# Patient Record
Sex: Male | Born: 1960 | Race: White | Hispanic: No | Marital: Married | Smoking: Never smoker
Health system: Southern US, Community
[De-identification: ages and names within clinical notes are randomized; demographics above are authoritative.]

## PROBLEM LIST (undated history)

## (undated) DIAGNOSIS — M109 Gout, unspecified: Secondary | ICD-10-CM

## (undated) DIAGNOSIS — Z8711 Personal history of peptic ulcer disease: Secondary | ICD-10-CM

## (undated) DIAGNOSIS — I10 Essential (primary) hypertension: Secondary | ICD-10-CM

## (undated) HISTORY — PX: CARPAL TUNNEL RELEASE: SHX101

---

## 2014-08-24 ENCOUNTER — Inpatient Hospital Stay (HOSPITAL_COMMUNITY)
Admission: EM | Admit: 2014-08-24 | Discharge: 2014-08-28 | DRG: 683 | Disposition: A | Discharge status: Home / Self Care | Payer: BLUE CROSS/BLUE SHIELD | Attending: Family Medicine | Admitting: Family Medicine

## 2014-08-24 ENCOUNTER — Encounter (HOSPITAL_COMMUNITY): Payer: Self-pay | Admitting: Emergency Medicine

## 2014-08-24 ENCOUNTER — Emergency Department (HOSPITAL_COMMUNITY): Payer: BLUE CROSS/BLUE SHIELD

## 2014-08-24 DIAGNOSIS — K219 Gastro-esophageal reflux disease without esophagitis: Secondary | ICD-10-CM | POA: Diagnosis present

## 2014-08-24 DIAGNOSIS — E872 Acidosis, unspecified: Secondary | ICD-10-CM

## 2014-08-24 DIAGNOSIS — E876 Hypokalemia: Secondary | ICD-10-CM | POA: Diagnosis present

## 2014-08-24 DIAGNOSIS — M109 Gout, unspecified: Secondary | ICD-10-CM | POA: Diagnosis present

## 2014-08-24 DIAGNOSIS — N19 Unspecified kidney failure: Secondary | ICD-10-CM | POA: Diagnosis not present

## 2014-08-24 DIAGNOSIS — R945 Abnormal results of liver function studies: Secondary | ICD-10-CM

## 2014-08-24 DIAGNOSIS — E1165 Type 2 diabetes mellitus with hyperglycemia: Secondary | ICD-10-CM | POA: Diagnosis present

## 2014-08-24 DIAGNOSIS — E860 Dehydration: Secondary | ICD-10-CM | POA: Diagnosis present

## 2014-08-24 DIAGNOSIS — N179 Acute kidney failure, unspecified: Principal | ICD-10-CM | POA: Diagnosis present

## 2014-08-24 DIAGNOSIS — Z825 Family history of asthma and other chronic lower respiratory diseases: Secondary | ICD-10-CM | POA: Diagnosis not present

## 2014-08-24 DIAGNOSIS — I10 Essential (primary) hypertension: Secondary | ICD-10-CM | POA: Diagnosis present

## 2014-08-24 DIAGNOSIS — G8929 Other chronic pain: Secondary | ICD-10-CM | POA: Diagnosis present

## 2014-08-24 DIAGNOSIS — R197 Diarrhea, unspecified: Secondary | ICD-10-CM | POA: Diagnosis not present

## 2014-08-24 DIAGNOSIS — E871 Hypo-osmolality and hyponatremia: Secondary | ICD-10-CM

## 2014-08-24 DIAGNOSIS — R112 Nausea with vomiting, unspecified: Secondary | ICD-10-CM | POA: Diagnosis not present

## 2014-08-24 DIAGNOSIS — R52 Pain, unspecified: Secondary | ICD-10-CM

## 2014-08-24 DIAGNOSIS — E119 Type 2 diabetes mellitus without complications: Secondary | ICD-10-CM

## 2014-08-24 DIAGNOSIS — A084 Viral intestinal infection, unspecified: Secondary | ICD-10-CM | POA: Diagnosis present

## 2014-08-24 DIAGNOSIS — Z8249 Family history of ischemic heart disease and other diseases of the circulatory system: Secondary | ICD-10-CM

## 2014-08-24 DIAGNOSIS — R7989 Other specified abnormal findings of blood chemistry: Secondary | ICD-10-CM

## 2014-08-24 HISTORY — DX: Essential (primary) hypertension: I10

## 2014-08-24 HISTORY — DX: Personal history of peptic ulcer disease: Z87.11

## 2014-08-24 HISTORY — DX: Gout, unspecified: M10.9

## 2014-08-24 LAB — CBC WITH DIFFERENTIAL/PLATELET
BASOS PCT: 0 % (ref 0–1)
Basophils Absolute: 0 10*3/uL (ref 0.0–0.1)
Eosinophils Absolute: 0 10*3/uL (ref 0.0–0.7)
Eosinophils Relative: 0 % (ref 0–5)
HEMATOCRIT: 43.6 % (ref 39.0–52.0)
HEMOGLOBIN: 16.1 g/dL (ref 13.0–17.0)
LYMPHS PCT: 9 % — AB (ref 12–46)
Lymphs Abs: 1 10*3/uL (ref 0.7–4.0)
MCH: 32.3 pg (ref 26.0–34.0)
MCHC: 36.9 g/dL — ABNORMAL HIGH (ref 30.0–36.0)
MCV: 87.6 fL (ref 78.0–100.0)
MONO ABS: 1.6 10*3/uL — AB (ref 0.1–1.0)
Monocytes Relative: 15 % — ABNORMAL HIGH (ref 3–12)
Neutro Abs: 8.5 10*3/uL — ABNORMAL HIGH (ref 1.7–7.7)
Neutrophils Relative %: 76 % (ref 43–77)
Platelets: 186 10*3/uL (ref 150–400)
RBC: 4.98 MIL/uL (ref 4.22–5.81)
RDW: 12.5 % (ref 11.5–15.5)
WBC: 11.2 10*3/uL — ABNORMAL HIGH (ref 4.0–10.5)

## 2014-08-24 LAB — MAGNESIUM: Magnesium: 2.1 mg/dL (ref 1.7–2.4)

## 2014-08-24 LAB — COMPREHENSIVE METABOLIC PANEL
ALK PHOS: 71 U/L (ref 38–126)
ALT: 88 U/L — ABNORMAL HIGH (ref 17–63)
ANION GAP: 22 — AB (ref 5–15)
AST: 113 U/L — ABNORMAL HIGH (ref 15–41)
Albumin: 3.6 g/dL (ref 3.5–5.0)
BUN: 65 mg/dL — AB (ref 6–20)
CALCIUM: 8 mg/dL — AB (ref 8.9–10.3)
CO2: 14 mmol/L — ABNORMAL LOW (ref 22–32)
Chloride: 87 mmol/L — ABNORMAL LOW (ref 101–111)
Creatinine, Ser: 4.86 mg/dL — ABNORMAL HIGH (ref 0.61–1.24)
GFR calc Af Amer: 14 mL/min — ABNORMAL LOW (ref >60)
GFR, EST NON AFRICAN AMERICAN: 12 mL/min — AB (ref >60)
Glucose, Bld: 295 mg/dL — ABNORMAL HIGH (ref 65–99)
POTASSIUM: 2.7 mmol/L — AB (ref 3.5–5.1)
Sodium: 123 mmol/L — ABNORMAL LOW (ref 135–145)
Total Bilirubin: 0.9 mg/dL (ref 0.3–1.2)
Total Protein: 7.9 g/dL (ref 6.5–8.1)

## 2014-08-24 LAB — BASIC METABOLIC PANEL
Anion gap: 16 — ABNORMAL HIGH (ref 5–15)
BUN: 63 mg/dL — ABNORMAL HIGH (ref 6–20)
CALCIUM: 7.1 mg/dL — AB (ref 8.9–10.3)
CO2: 15 mmol/L — AB (ref 22–32)
CREATININE: 4.43 mg/dL — AB (ref 0.61–1.24)
Chloride: 94 mmol/L — ABNORMAL LOW (ref 101–111)
GFR calc Af Amer: 16 mL/min — ABNORMAL LOW (ref >60)
GFR calc non Af Amer: 14 mL/min — ABNORMAL LOW (ref >60)
Glucose, Bld: 229 mg/dL — ABNORMAL HIGH (ref 65–99)
POTASSIUM: 3.1 mmol/L — AB (ref 3.5–5.1)
Sodium: 125 mmol/L — ABNORMAL LOW (ref 135–145)

## 2014-08-24 LAB — GLUCOSE, CAPILLARY: GLUCOSE-CAPILLARY: 225 mg/dL — AB (ref 65–99)

## 2014-08-24 LAB — LACTIC ACID, PLASMA: Lactic Acid, Venous: 1.7 mmol/L (ref 0.5–2.0)

## 2014-08-24 MED ORDER — SODIUM CHLORIDE 0.9 % IJ SOLN
3.0000 mL | Freq: Two times a day (BID) | INTRAMUSCULAR | Status: DC
  Administered 2014-08-24 – 2014-08-28 (×4): 3 mL via INTRAVENOUS

## 2014-08-24 MED ORDER — RISAQUAD PO CAPS
2.0000 | ORAL_CAPSULE | Freq: Every day | ORAL | Status: DC
  Administered 2014-08-24 – 2014-08-28 (×5): 2 via ORAL
  Filled 2014-08-24 (×5): qty 2

## 2014-08-24 MED ORDER — TRAZODONE HCL 50 MG PO TABS
50.0000 mg | ORAL_TABLET | Freq: Every evening | ORAL | Status: DC | PRN
  Administered 2014-08-25: 100 mg via ORAL
  Filled 2014-08-24 (×2): qty 2

## 2014-08-24 MED ORDER — ACETAMINOPHEN 325 MG PO TABS
650.0000 mg | ORAL_TABLET | Freq: Four times a day (QID) | ORAL | Status: DC | PRN
Start: 2014-08-24 — End: 2014-08-28
  Filled 2014-08-24 (×2): qty 2

## 2014-08-24 MED ORDER — PANTOPRAZOLE SODIUM 40 MG PO TBEC
40.0000 mg | DELAYED_RELEASE_TABLET | Freq: Every day | ORAL | Status: DC
  Administered 2014-08-24 – 2014-08-28 (×5): 40 mg via ORAL
  Filled 2014-08-24 (×5): qty 1

## 2014-08-24 MED ORDER — ONDANSETRON HCL 4 MG PO TABS: 4.0000 mg | ORAL_TABLET | Freq: Four times a day (QID) | ORAL | Status: DC | PRN

## 2014-08-24 MED ORDER — ACETAMINOPHEN 650 MG RE SUPP: 650.0000 mg | Freq: Four times a day (QID) | RECTAL | Status: DC | PRN

## 2014-08-24 MED ORDER — SODIUM CHLORIDE 0.9 % IV SOLN
INTRAVENOUS | Status: DC
  Administered 2014-08-25 – 2014-08-26 (×4): via INTRAVENOUS
  Administered 2014-08-26: 75 mL/h via INTRAVENOUS
  Administered 2014-08-27: 10:00:00 via INTRAVENOUS

## 2014-08-24 MED ORDER — POTASSIUM CHLORIDE CRYS ER 20 MEQ PO TBCR
40.0000 meq | EXTENDED_RELEASE_TABLET | Freq: Once | ORAL | Status: AC
  Administered 2014-08-24: 40 meq via ORAL
  Filled 2014-08-24: qty 2

## 2014-08-24 MED ORDER — ONDANSETRON HCL 4 MG/2ML IJ SOLN: 4.0000 mg | Freq: Four times a day (QID) | INTRAMUSCULAR | Status: DC | PRN

## 2014-08-24 MED ORDER — SODIUM CHLORIDE 0.9 % IV BOLUS (SEPSIS)
2000.0000 mL | Freq: Once | INTRAVENOUS | Status: AC
  Administered 2014-08-24: 2000 mL via INTRAVENOUS

## 2014-08-24 MED ORDER — HEPARIN SODIUM (PORCINE) 5000 UNIT/ML IJ SOLN
5000.0000 [IU] | Freq: Three times a day (TID) | INTRAMUSCULAR | Status: DC
  Administered 2014-08-24 – 2014-08-28 (×11): 5000 [IU] via SUBCUTANEOUS
  Filled 2014-08-24 (×10): qty 1

## 2014-08-24 MED ORDER — KETOROLAC TROMETHAMINE 30 MG/ML IJ SOLN
30.0000 mg | Freq: Once | INTRAMUSCULAR | Status: AC
  Administered 2014-08-24: 30 mg via INTRAVENOUS
  Filled 2014-08-24: qty 1

## 2014-08-24 MED ORDER — INSULIN ASPART 100 UNIT/ML ~~LOC~~ SOLN
0.0000 [IU] | Freq: Three times a day (TID) | SUBCUTANEOUS | Status: DC
  Administered 2014-08-25: 2 [IU] via SUBCUTANEOUS
  Administered 2014-08-25: 1 [IU] via SUBCUTANEOUS
  Administered 2014-08-25: 2 [IU] via SUBCUTANEOUS
  Administered 2014-08-26 – 2014-08-28 (×7): 1 [IU] via SUBCUTANEOUS

## 2014-08-24 MED ORDER — POTASSIUM CHLORIDE 10 MEQ/100ML IV SOLN
10.0000 meq | Freq: Once | INTRAVENOUS | Status: AC
  Administered 2014-08-24: 10 meq via INTRAVENOUS
  Filled 2014-08-24: qty 100

## 2014-08-24 MED ORDER — POTASSIUM CHLORIDE 10 MEQ/100ML IV SOLN
10.0000 meq | INTRAVENOUS | Status: AC
  Administered 2014-08-24 – 2014-08-25 (×6): 10 meq via INTRAVENOUS
  Filled 2014-08-24 (×4): qty 100

## 2014-08-24 NOTE — ED Notes (Signed)
CRITICAL VALUE ALERT  Critical value received:  POTASSIUM 2.7  Date of notification:  08/24/14  Time of notification:  1748  Critical value read back:Yes.    Nurse who received alert:  Viviano SimasLauren Stevie Ertle, RN  MD notified (1st page):  [place]  Time of first page:  [phone]

## 2014-08-24 NOTE — ED Notes (Signed)
Pt reports generalized body aches,nausea,emesis,diarrhea, intermittent fever.nad noted.

## 2014-08-24 NOTE — H&P (Signed)
[facility] History and Physical  Bruce Blevins ZOX:[id number] DOB: Mar 23, 1960 DOA: 08/24/2014  Referring physician: Dr. Estell Harpin - [facility] PCP: No primary care provider on file.   Chief Complaint: Diarrhea  HPI: Bruce Blevins is a 54 y.o. male  Diarrhea. Started 4-5 days ago. Watery and non-bloody. Getting worse. 10-12 Episodes per day . Started a couple hours after lunch. Initially felt weak and feverish. Symptoms come and go. Has not taken anything for the symptoms. Ice and water w/o improvement. Anorexia during this time. No nausea.  No sick contacts    Review of Systems:  Constitutional:  No weight loss, night sweats, Fevers, chills, fatigue.  HEENT:  No headaches, Difficulty swallowing,Tooth/dental problems,Sore throat,  No sneezing, itching, ear ache, nasal congestion, post nasal drip,  Cardio-vascular:  No chest pain, Orthopnea, PND, swelling in lower extremities, anasarca, dizziness, palpitations  GI: Pe RHPI Resp:   No shortness of breath with exertion or at rest. No excess mucus, no productive cough, No non-productive cough, No coughing up of blood.No change in color of mucus.No wheezing.No chest wall deformity  Skin:  no rash or lesions.  GU:  no dysuria, change in color of urine, no urgency or frequency. No flank pain.  Musculoskeletal:   No joint pain or swelling. No decreased range of motion. No back pain.  Psych:  No change in mood or affect. No depression or anxiety. No memory loss.   Past Medical History  Diagnosis Date  . Hypertension   . History of bleeding ulcers   . Gout    Past Surgical History  Procedure Laterality Date  . Carpal tunnel release     Social History:  reports that he has never smoked. He does not have any smokeless tobacco history on file. He reports that he drinks alcohol. He reports that he does not use illicit drugs.  Allergies  Allergen Reactions  . Hydrocodone     Generalized itching.    Family History  Problem Relation  Age of Onset  . Hypertension Father   . Hypertension Mother   . COPD Mother      Prior to Admission medications   Medication Sig Start Date End Date Taking? Authorizing Provider  Acetaminophen-Caffeine 500-65 MG TABS Take 1.5-2 tablets by mouth daily as needed (for pain/migraine pain).   Yes Historical Provider, MD  amLODipine (NORVASC) 5 MG tablet Take 5 mg by mouth daily. 06/09/14  Yes Historical Provider, MD  omeprazole (PRILOSEC) 20 MG capsule Take 20 mg by mouth daily as needed (for acid reflux).   Yes Historical Provider, MD  telmisartan (MICARDIS) 80 MG tablet Take 80 mg by mouth daily. 06/09/14  Yes Historical Provider, MD  predniSONE (DELTASONE) 20 MG tablet Take 1 tablet by mouth daily. 06/25/14   Historical Provider, MD   Physical Exam: Filed Vitals:   08/24/14 1830 08/24/14 1900 08/24/14 1930 08/24/14 2000  BP: 113/80 108/65 127/81 100/62  Pulse: 88 85 89 86  Temp:    99 F (37.2 C)  TempSrc:    Oral  Resp: 22 35 23 21  Height:      Weight:      SpO2: 100% 97% 98% 97%    Wt Readings from Last 3 Encounters:  08/24/14 81.647 kg (180 lb)    General:  Appears calm and comfortable Eyes:  PERRL, normal lids, irises & conjunctiva ENT:  grossly normal hearing, lips & tongue Neck:  no LAD, masses or thyromegaly Cardiovascular:  RRR, no m/r/g. No LE edema. Telemetry:  SR, no arrhythmias  Respiratory:  CTA bilaterally, no w/r/r. Normal respiratory effort. Abdomen:  soft, ntnd, NABS Skin:  no rash or induration seen on limited exam Musculoskeletal:  grossly normal tone BUE/BLE Psychiatric:  grossly normal mood and affect, speech fluent and appropriate Neurologic:  grossly non-focal.          Labs on Admission:  Basic Metabolic Panel:  Recent Labs Lab 08/24/14 1650  NA 123*  K 2.7*  CL 87*  CO2 14*  GLUCOSE 295*  BUN 65*  CREATININE 4.86*  CALCIUM 8.0*   Liver Function Tests:  Recent Labs Lab 08/24/14 1650  AST 113*  ALT 88*  ALKPHOS 71  BILITOT 0.9    PROT 7.9  ALBUMIN 3.6   No results for input(s): LIPASE, AMYLASE in the last 168 hours. No results for input(s): AMMONIA in the last 168 hours. CBC:  Recent Labs Lab 08/24/14 1650  WBC 11.2*  NEUTROABS 8.5*  HGB 16.1  HCT 43.6  MCV 87.6  PLT 186   Cardiac Enzymes: No results for input(s): CKTOTAL, CKMB, CKMBINDEX, TROPONINI in the last 168 hours.  BNP (last 3 results) No results for input(s): BNP in the last 8760 hours.  ProBNP (last 3 results) No results for input(s): PROBNP in the last 8760 hours.  CBG: No results for input(s): GLUCAP in the last 168 hours.  Radiological Exams on Admission: Dg Abd Acute W/chest  08/24/2014   CLINICAL DATA:  Generalized body aches, weakness, nausea and diarrhea. Intermittent fever since Sunday.  EXAM: DG ABDOMEN ACUTE W/ 1V CHEST  COMPARISON:  None.  FINDINGS: Frontal view of the chest shows midline trachea and normal heart size. Minimal scarring in the medial left lower lobe. Lungs are otherwise clear.  Two views of the abdomen show gas in nondilated colon with minimal small bowel gas. Overall, relative paucity of gas in the abdomen. No unexpected radiopaque calculi. No free air.  IMPRESSION: Relative paucity of gas in the abdomen is nonspecific. No evidence of small-bowel obstruction.   Electronically Signed   By: Leanna BattlesMelinda  Blietz M.D.   On: 08/24/2014 17:23     Assessment/Plan Principal Problem:   Renal failure Active Problems:   Hyponatremia   Hypokalemia   Essential hypertension, benign   Metabolic acidosis   Diarrhea   GERD without esophagitis   Gout   ARF: minimal urine output and creatinine 4.86 on admission. Likely secondary to severe dehydration due to GI infection. - IVF - consider renal consult if not improving. - f/u BMET  Hyponatremia: likely secondary to severe GI loss. Associated left findings of hypokalemia. - Telemetry - EKG - NS IV ( goal correction of less than 8-9 mEq NA per day) - BMET Q6 - Mag - Kcl  10mEq x6  Metabolic acidosis: Anion gap 22. Bicarbonate 14. Likely mixed picture from GI losses, with bicarbonate loss from acute renal failure and anion gap from underlying infection, and possible diabetic ketoacidosis. Lactic acid 1.7. WBC 11.2. - treatment as above - no need for bicarbonate replacement at this point time.  Diarrhea: Likely secondary to viral gastroenteritis versus acute bacterial. WBC 11.2, afebrile, and no abdominal pain. No bloody stools. - C. difficile - Stool culture - probiotic - IVF as above - Zofran - Stool ova and parasite - Clear liquid diet. ADAT  Hyperglycemia: 295. Likely secondary to underlying infection and possible underlying diabetes etiology - A1c - SSI  Hypertension: Normotensive to hypotensive on admission. Likely dehydrated. - Hold home Micardis and amlodipine  GERD: - Continue PPI  Gout: No gout flare in several months. - Hold patient's when necessary prednisone  Code Status: FULL DVT Prophylaxis: Hep Family Communication: Aunt  Disposition Plan: Pending improvement    Kahle Mcqueen Shela Commons, MD Family Medicine [facility] [email or web address] Password [email or web address]

## 2014-08-24 NOTE — ED Provider Notes (Signed)
CSN: [id number]     Arrival date & time 08/24/14  1602 History   First MD Initiated Contact with Patient 08/24/14 1619     Chief Complaint  Patient presents with  . Generalized Body Aches     (Consider location/radiation/quality/duration/timing/severity/associated sxs/prior Treatment) Patient is a 54 y.o. male presenting with diarrhea. The history is provided by the patient (the pt complains of diarrhea for 4-5 days and weakness).  Diarrhea Quality:  Watery Severity:  Moderate Onset quality:  Sudden Timing:  Constant Progression:  Unchanged Relieved by:  Nothing Associated symptoms: no abdominal pain and no headaches     Past Medical History  Diagnosis Date  . Hypertension   . History of bleeding ulcers    Past Surgical History  Procedure Laterality Date  . Carpal tunnel release     History reviewed. No pertinent family history. History  Substance Use Topics  . Smoking status: Never Smoker   . Smokeless tobacco: Not on file  . Alcohol Use: Yes     Comment: occasional     Review of Systems  Constitutional: Negative for appetite change and fatigue.  HENT: Negative for congestion, ear discharge and sinus pressure.   Eyes: Negative for discharge.  Respiratory: Negative for cough.   Cardiovascular: Negative for chest pain.  Gastrointestinal: Positive for diarrhea. Negative for abdominal pain.  Genitourinary: Negative for frequency and hematuria.  Musculoskeletal: Negative for back pain.  Skin: Negative for rash.  Neurological: Negative for seizures and headaches.  Psychiatric/Behavioral: Negative for hallucinations.      Allergies  Hydrocodone  Home Medications   Prior to Admission medications   Medication Sig Start Date End Date Taking? Authorizing Provider  Acetaminophen-Caffeine 500-65 MG TABS Take 1.5-2 tablets by mouth daily as needed (for pain/migraine pain).   Yes Historical Provider, MD  amLODipine (NORVASC) 5 MG tablet Take 5 mg by mouth daily.  06/09/14  Yes Historical Provider, MD  omeprazole (PRILOSEC) 20 MG capsule Take 20 mg by mouth daily as needed (for acid reflux).   Yes Historical Provider, MD  telmisartan (MICARDIS) 80 MG tablet Take 80 mg by mouth daily. 06/09/14  Yes Historical Provider, MD  predniSONE (DELTASONE) 20 MG tablet Take 1 tablet by mouth daily. 06/25/14   Historical Provider, MD   BP 113/80 mmHg  Pulse 88  Temp(Src) 98 F (36.7 C) (Oral)  Resp 22  Ht 5\' 2"  (1.575 m)  Wt 180 lb (81.647 kg)  BMI 32.91 kg/m2  SpO2 100% Physical Exam  Constitutional: He is oriented to person, place, and time. He appears well-developed.  HENT:  Head: Normocephalic.  Eyes: Conjunctivae and EOM are normal. No scleral icterus.  Neck: Neck supple. No thyromegaly present.  Cardiovascular: Normal rate and regular rhythm.  Exam reveals no gallop and no friction rub.   No murmur heard. Pulmonary/Chest: No stridor. He has no wheezes. He has no rales. He exhibits no tenderness.  Abdominal: He exhibits no distension. There is no tenderness. There is no rebound.  Musculoskeletal: Normal range of motion. He exhibits no edema.  Lymphadenopathy:    He has no cervical adenopathy.  Neurological: He is oriented to person, place, and time. He exhibits normal muscle tone. Coordination normal.  Skin: No rash noted. No erythema.  Psychiatric: He has a normal mood and affect. His behavior is normal.    ED Course  Procedures (including critical care time) Labs Review Labs Reviewed  CBC WITH DIFFERENTIAL/PLATELET - Abnormal; Notable for the following:    WBC 11.2 (*)  MCHC 36.9 (*)    Neutro Abs 8.5 (*)    Lymphocytes Relative 9 (*)    Monocytes Relative 15 (*)    Monocytes Absolute 1.6 (*)    All other components within normal limits  COMPREHENSIVE METABOLIC PANEL - Abnormal; Notable for the following:    Sodium 123 (*)    Potassium 2.7 (*)    Chloride 87 (*)    CO2 14 (*)    Glucose, Bld 295 (*)    BUN 65 (*)    Creatinine, Ser  4.86 (*)    Calcium 8.0 (*)    AST 113 (*)    ALT 88 (*)    GFR calc non Af Amer 12 (*)    GFR calc Af Amer 14 (*)    Anion gap 22 (*)    All other components within normal limits  LACTIC ACID, PLASMA  URINALYSIS, ROUTINE W REFLEX MICROSCOPIC (NOT AT [facility])    Imaging Review Dg Abd Acute W/chest  08/24/2014   CLINICAL DATA:  Generalized body aches, weakness, nausea and diarrhea. Intermittent fever since Sunday.  EXAM: DG ABDOMEN ACUTE W/ 1V CHEST  COMPARISON:  None.  FINDINGS: Frontal view of the chest shows midline trachea and normal heart size. Minimal scarring in the medial left lower lobe. Lungs are otherwise clear.  Two views of the abdomen show gas in nondilated colon with minimal small bowel gas. Overall, relative paucity of gas in the abdomen. No unexpected radiopaque calculi. No free air.  IMPRESSION: Relative paucity of gas in the abdomen is nonspecific. No evidence of small-bowel obstruction.   Electronically Signed   By: Leanna Battles M.D.   On: 08/24/2014 17:23     EKG Interpretation None      MDM   Final diagnoses:  Pain  Renal failure    Admit for renal failure and hypokalemia     Bethann Berkshire, MD 08/24/14 1851

## 2014-08-24 NOTE — Progress Notes (Signed)
Patient would like something to help him sleep. I paged the admitting MD, will follow any new orders given.

## 2014-08-25 ENCOUNTER — Encounter (HOSPITAL_COMMUNITY): Payer: Self-pay | Admitting: *Deleted

## 2014-08-25 ENCOUNTER — Inpatient Hospital Stay (HOSPITAL_COMMUNITY): Payer: BLUE CROSS/BLUE SHIELD

## 2014-08-25 DIAGNOSIS — I10 Essential (primary) hypertension: Secondary | ICD-10-CM

## 2014-08-25 DIAGNOSIS — N19 Unspecified kidney failure: Secondary | ICD-10-CM

## 2014-08-25 LAB — BASIC METABOLIC PANEL
ANION GAP: 13 (ref 5–15)
ANION GAP: 12 (ref 5–15)
ANION GAP: 12 (ref 5–15)
BUN: 63 mg/dL — AB (ref 6–20)
BUN: 55 mg/dL — AB (ref 6–20)
BUN: 49 mg/dL — ABNORMAL HIGH (ref 6–20)
CALCIUM: 7 mg/dL — AB (ref 8.9–10.3)
CALCIUM: 6.9 mg/dL — AB (ref 8.9–10.3)
CO2: 15 mmol/L — AB (ref 22–32)
CO2: 14 mmol/L — ABNORMAL LOW (ref 22–32)
CO2: 15 mmol/L — AB (ref 22–32)
CREATININE: 4.07 mg/dL — AB (ref 0.61–1.24)
Calcium: 6.9 mg/dL — ABNORMAL LOW (ref 8.9–10.3)
Chloride: 99 mmol/L — ABNORMAL LOW (ref 101–111)
Chloride: 97 mmol/L — ABNORMAL LOW (ref 101–111)
Chloride: 96 mmol/L — ABNORMAL LOW (ref 101–111)
Creatinine, Ser: 3.68 mg/dL — ABNORMAL HIGH (ref 0.61–1.24)
Creatinine, Ser: 3.32 mg/dL — ABNORMAL HIGH (ref 0.61–1.24)
GFR calc Af Amer: 17 mL/min — ABNORMAL LOW (ref >60)
GFR calc Af Amer: 22 mL/min — ABNORMAL LOW (ref >60)
GFR calc non Af Amer: 17 mL/min — ABNORMAL LOW (ref >60)
GFR calc non Af Amer: 19 mL/min — ABNORMAL LOW (ref >60)
GFR, EST AFRICAN AMERICAN: 20 mL/min — AB (ref >60)
GFR, EST NON AFRICAN AMERICAN: 15 mL/min — AB (ref >60)
GLUCOSE: 186 mg/dL — AB (ref 65–99)
GLUCOSE: 157 mg/dL — AB (ref 65–99)
Glucose, Bld: 199 mg/dL — ABNORMAL HIGH (ref 65–99)
Potassium: 3.1 mmol/L — ABNORMAL LOW (ref 3.5–5.1)
Potassium: 3 mmol/L — ABNORMAL LOW (ref 3.5–5.1)
Potassium: 2.7 mmol/L — CL (ref 3.5–5.1)
SODIUM: 127 mmol/L — AB (ref 135–145)
SODIUM: 123 mmol/L — AB (ref 135–145)
SODIUM: 123 mmol/L — AB (ref 135–145)

## 2014-08-25 LAB — URINALYSIS, ROUTINE W REFLEX MICROSCOPIC
Bilirubin Urine: NEGATIVE
GLUCOSE, UA: 250 mg/dL — AB
Ketones, ur: NEGATIVE mg/dL
LEUKOCYTES UA: NEGATIVE
Nitrite: NEGATIVE
PH: 5.5 (ref 5.0–8.0)
PROTEIN: 30 mg/dL — AB
SPECIFIC GRAVITY, URINE: 1.025 (ref 1.005–1.030)
Urobilinogen, UA: 0.2 mg/dL (ref 0.0–1.0)

## 2014-08-25 LAB — URINE MICROSCOPIC-ADD ON

## 2014-08-25 LAB — GLUCOSE, CAPILLARY
GLUCOSE-CAPILLARY: 167 mg/dL — AB (ref 65–99)
Glucose-Capillary: 192 mg/dL — ABNORMAL HIGH (ref 65–99)
Glucose-Capillary: 127 mg/dL — ABNORMAL HIGH (ref 65–99)
Glucose-Capillary: 119 mg/dL — ABNORMAL HIGH (ref 65–99)

## 2014-08-25 LAB — CLOSTRIDIUM DIFFICILE BY PCR: CDIFFPCR: NEGATIVE

## 2014-08-25 LAB — NA AND K (SODIUM & POTASSIUM), RAND UR
Potassium Urine: 14 mmol/L
SODIUM UR: 50 mmol/L

## 2014-08-25 MED ORDER — TRAMADOL HCL 50 MG PO TABS
50.0000 mg | ORAL_TABLET | Freq: Two times a day (BID) | ORAL | Status: DC | PRN
  Administered 2014-08-25 – 2014-08-28 (×6): 50 mg via ORAL
  Filled 2014-08-25 (×7): qty 1

## 2014-08-25 MED ORDER — POTASSIUM CHLORIDE CRYS ER 20 MEQ PO TBCR
40.0000 meq | EXTENDED_RELEASE_TABLET | ORAL | Status: AC
  Administered 2014-08-25 (×3): 40 meq via ORAL
  Filled 2014-08-25 (×3): qty 2

## 2014-08-25 MED ORDER — POTASSIUM CHLORIDE CRYS ER 20 MEQ PO TBCR
40.0000 meq | EXTENDED_RELEASE_TABLET | Freq: Once | ORAL | Status: DC
  Filled 2014-08-25: qty 2

## 2014-08-25 MED ORDER — POTASSIUM CHLORIDE CRYS ER 20 MEQ PO TBCR: 40.0000 meq | EXTENDED_RELEASE_TABLET | Freq: Two times a day (BID) | ORAL | Status: DC

## 2014-08-25 NOTE — Progress Notes (Signed)
PROGRESS NOTE  Bruce Blevins YHC:[id number] DOB: Jan 12, 1961 DOA: 08/24/2014 PCP: No primary care provider on file.  Assessment/Plan: ARF: unknown baseline -will try to get records from PCP -Likely secondary to severe dehydration due to GI infection. - IVF - f/u BMET  Hyponatremia: likely secondary to severe GI loss/dehydration - IVF -improving  Hypokalemia -replete  Metabolic acidosis: Anion gap 22. Bicarbonate 14. Likely mixed picture from GI losses, with bicarbonate loss from acute renal failure and anion gap from underlying infection - treatment as above - no need for bicarbonate replacement at this point time.  Diarrhea: Likely secondary to viral gastroenteritis versus acute bacterial. WBC 11.2, afebrile, and no abdominal pain. No bloody stools. - C. difficile - Stool culture - probiotic - IVF as above - Zofran - Stool ova and parasite - Clear liquid diet-advance as tolerated  Hyperglycemia: 295. Likely secondary to underlying infection and possible underlying diabetes etiology- appears to be on steroids at home PRN for gout - A1c - SSI  Elevated AST/ALT -from hypotension -trend  Hypertension: Normotensive to hypotensive on admission. Likely dehydrated. - Hold home Micardis and amlodipine  GERD: - Continue PPI  Gout: No gout flare in several months. - Hold patient's when necessary prednisone   Code Status: full Family Communication: patient Disposition Plan:    Consultants:    Procedures:      HPI/Subjective: Anxious about being d/c'd soon as his mother who is 32 is home alone Eating better and has more UOP  Objective: Filed Vitals:   08/25/14 0637  BP: 122/74  Pulse: 73  Temp: 98.5 F (36.9 C)  Resp: 20   No intake or output data in the 24 hours ending 08/25/14 0912 Filed Weights   08/24/14 1614 08/24/14 2130  Weight: 81.647 kg (180 lb) 88.996 kg (196 lb 3.2 oz)    Exam:   General:  A+Ox3, NAD  Cardiovascular:  rrr  Respiratory: clear  Abdomen: +BS, soft  Musculoskeletal: no edema   Data Reviewed: Basic Metabolic Panel:  Recent Labs Lab 08/24/14 1650 08/24/14 2059 08/25/14 0239  NA 123* 125* 127*  K 2.7* 3.1* 3.1*  CL 87* 94* 99*  CO2 14* 15* 15*  GLUCOSE 295* 229* 186*  BUN 65* 63* 63*  CREATININE 4.86* 4.43* 4.07*  CALCIUM 8.0* 7.1* 6.9*  MG  --  2.1  --    Liver Function Tests:  Recent Labs Lab 08/24/14 1650  AST 113*  ALT 88*  ALKPHOS 71  BILITOT 0.9  PROT 7.9  ALBUMIN 3.6   No results for input(s): LIPASE, AMYLASE in the last 168 hours. No results for input(s): AMMONIA in the last 168 hours. CBC:  Recent Labs Lab 08/24/14 1650  WBC 11.2*  NEUTROABS 8.5*  HGB 16.1  HCT 43.6  MCV 87.6  PLT 186   Cardiac Enzymes: No results for input(s): CKTOTAL, CKMB, CKMBINDEX, TROPONINI in the last 168 hours. BNP (last 3 results) No results for input(s): BNP in the last 8760 hours.  ProBNP (last 3 results) No results for input(s): PROBNP in the last 8760 hours.  CBG:  Recent Labs Lab 08/24/14 2143 08/25/14 0722  GLUCAP 225* 167*    Recent Results (from the past 240 hour(s))  Clostridium Difficile by PCR (not at [facility])     Status: None   Collection Time: 08/24/14 11:50 PM  Result Value Ref Range Status   C difficile by pcr NEGATIVE NEGATIVE Final     Studies: Dg Abd Acute W/chest  08/24/2014   CLINICAL DATA:  Generalized body aches, weakness, nausea and diarrhea. Intermittent fever since Sunday.  EXAM: DG ABDOMEN ACUTE W/ 1V CHEST  COMPARISON:  None.  FINDINGS: Frontal view of the chest shows midline trachea and normal heart size. Minimal scarring in the medial left lower lobe. Lungs are otherwise clear.  Two views of the abdomen show gas in nondilated colon with minimal small bowel gas. Overall, relative paucity of gas in the abdomen. No unexpected radiopaque calculi. No free air.  IMPRESSION: Relative paucity of gas in the abdomen is nonspecific. No evidence  of small-bowel obstruction.   Electronically Signed   By: Leanna Battles M.D.   On: 08/24/2014 17:23    Scheduled Meds: . acidophilus  2 capsule Oral Daily  . heparin  5,000 Units Subcutaneous 3 times per day  . insulin aspart  0-9 Units Subcutaneous TID WC  . pantoprazole  40 mg Oral Daily  . sodium chloride  3 mL Intravenous Q12H   Continuous Infusions: . sodium chloride 125 mL/hr at 08/25/14 0603   Antibiotics Given (last 72 hours)    None      Principal Problem:   Renal failure Active Problems:   Hyponatremia   Hypokalemia   Essential hypertension, benign   Metabolic acidosis   Diarrhea   GERD without esophagitis   Gout    Time spent: 35 min    Zeniyah Peaster  [facility] Pager [phone]. If 7PM-7AM, please contact night-coverage at [email or web address], password [facility] 08/25/2014, 9:12 AM  LOS: 1 day

## 2014-08-25 NOTE — Progress Notes (Signed)
1456 Patient c/o headache and is requesting something for pain. He reports that Tylenol isn't effective for him. MD notified.

## 2014-08-25 NOTE — Care Management Note (Signed)
Case Management Note  Patient Details  Name: Kidus Bestul MRN: [id number] Date of Birth: 25-Mar-1960  Subjective/Objective:                  Pt admitted from home with AKI. Pt lives with his mom and will return home at discharge. Pt is independent with ADL's.   Action/Plan: No CM needs noted.  Expected Discharge Date:  08/26/14               Expected Discharge Plan:  Home/Self Care  In-House Referral:  NA  Discharge planning Services  CM Consult  Post Acute Care Choice:  NA Choice offered to:  NA  DME Arranged:    DME Agency:     HH Arranged:    HH Agency:     Status of Service:  Completed, signed off  Medicare Important Message Given:    Date Medicare IM Given:    Medicare IM give by:    Date Additional Medicare IM Given:    Additional Medicare Important Message give by:     If discussed at Long Length of Stay Meetings, dates discussed:    Additional Comments:  Cheryl Flash, RN 08/25/2014, 2:07 PM

## 2014-08-25 NOTE — Progress Notes (Signed)
1545 Lab called to report critical K+ 2.7, MD notified.

## 2014-08-26 LAB — CBC
HCT: 37.3 % — ABNORMAL LOW (ref 39.0–52.0)
Hemoglobin: 13.1 g/dL (ref 13.0–17.0)
MCH: 31.3 pg (ref 26.0–34.0)
MCHC: 35.1 g/dL (ref 30.0–36.0)
MCV: 89.2 fL (ref 78.0–100.0)
Platelets: 136 10*3/uL — ABNORMAL LOW (ref 150–400)
RBC: 4.18 MIL/uL — AB (ref 4.22–5.81)
RDW: 12.6 % (ref 11.5–15.5)
WBC: 7 10*3/uL (ref 4.0–10.5)

## 2014-08-26 LAB — GLUCOSE, CAPILLARY
GLUCOSE-CAPILLARY: 123 mg/dL — AB (ref 65–99)
GLUCOSE-CAPILLARY: 109 mg/dL — AB (ref 65–99)
Glucose-Capillary: 131 mg/dL — ABNORMAL HIGH (ref 65–99)
Glucose-Capillary: 133 mg/dL — ABNORMAL HIGH (ref 65–99)

## 2014-08-26 LAB — MAGNESIUM: MAGNESIUM: 2 mg/dL (ref 1.7–2.4)

## 2014-08-26 LAB — HEMOGLOBIN A1C
HEMOGLOBIN A1C: 12 % — AB (ref 4.8–5.6)
MEAN PLASMA GLUCOSE: 298 mg/dL

## 2014-08-26 LAB — COMPREHENSIVE METABOLIC PANEL
ALT: 167 U/L — ABNORMAL HIGH (ref 17–63)
AST: 203 U/L — ABNORMAL HIGH (ref 15–41)
Albumin: 2.7 g/dL — ABNORMAL LOW (ref 3.5–5.0)
Alkaline Phosphatase: 64 U/L (ref 38–126)
Anion gap: 10 (ref 5–15)
BUN: 36 mg/dL — ABNORMAL HIGH (ref 6–20)
CO2: 14 mmol/L — ABNORMAL LOW (ref 22–32)
Calcium: 7.2 mg/dL — ABNORMAL LOW (ref 8.9–10.3)
Chloride: 106 mmol/L (ref 101–111)
Creatinine, Ser: 2.39 mg/dL — ABNORMAL HIGH (ref 0.61–1.24)
GFR calc Af Amer: 33 mL/min — ABNORMAL LOW (ref >60)
GFR calc non Af Amer: 29 mL/min — ABNORMAL LOW (ref >60)
GLUCOSE: 122 mg/dL — AB (ref 65–99)
POTASSIUM: 3.2 mmol/L — AB (ref 3.5–5.1)
Sodium: 130 mmol/L — ABNORMAL LOW (ref 135–145)
Total Bilirubin: 1 mg/dL (ref 0.3–1.2)
Total Protein: 5.9 g/dL — ABNORMAL LOW (ref 6.5–8.1)

## 2014-08-26 LAB — OSMOLALITY, URINE: Osmolality, Ur: 392 mOsm/kg (ref 390–1090)

## 2014-08-26 LAB — OVA AND PARASITE EXAMINATION

## 2014-08-26 MED ORDER — LIVING WELL WITH DIABETES BOOK
Freq: Once | Status: AC
  Administered 2014-08-26: 09:00:00
  Filled 2014-08-26: qty 1

## 2014-08-26 MED ORDER — POTASSIUM CHLORIDE CRYS ER 20 MEQ PO TBCR
40.0000 meq | EXTENDED_RELEASE_TABLET | Freq: Once | ORAL | Status: AC
  Administered 2014-08-26: 40 meq via ORAL

## 2014-08-26 NOTE — Care Management Note (Signed)
Case Management Note  Patient Details  Name: Bruce Blevins MRN: [id number] Date of Birth: 25-Aug-1960  Subjective/Objective:                    Action/Plan:   Expected Discharge Date:  08/26/14               Expected Discharge Plan:  Home/Self Care  In-House Referral:  NA  Discharge planning Services  CM Consult  Post Acute Care Choice:  NA Choice offered to:  NA  DME Arranged:    DME Agency:     HH Arranged:    HH Agency:     Status of Service:  Completed, signed off  Medicare Important Message Given:    Date Medicare IM Given:    Medicare IM give by:    Date Additional Medicare IM Given:    Additional Medicare Important Message give by:     If discussed at Long Length of Stay Meetings, dates discussed:    Additional Comments: Anticipate discharge within 48 hours. No Cm needs noted. Arlyss Queen Eldon, RN 08/26/2014, 1:07 PM

## 2014-08-26 NOTE — Progress Notes (Signed)
Spoke with patient about new diabetes diagnosis. Discussed A1C results (12.0% on 08/24/14) and explained what an A1C is, basic pathophysiology of DM Type 2, basic home care, importance of checking CBGs and maintaining good CBG control to prevent long-term and short-term complications. Reviewed Living Well with Diabetes booklet. Discussed impact of nutrition, exercise, stress, sickness, and medications on diabetes control. Discussed carbohydrates, carbohydrate goals per day and meal, along with portion sizes. Asked patient to review book in detail and watch patient education videos on diabetes. Patient verbalized understanding of information discussed and he states that he has no further questions at this time related to diabetes. RNs to provide ongoing basic DM education at bedside with this patient and engage patient to actively check blood glucose and administer insulin injections. Have ordered educational booklet, RD consult, and DM videos.   Patient only required a total of Novolog 5 units on 08/25/14 and glucose has ranged from 119-192 mg/dl over the past 24 hours. In reviewing inpatient glycemic control, recommend patient be discharged on oral DM medication at this time and have him follow up with his PCP.  Orlando Penner, RN, MSN, CCRN, CDE Diabetes Coordinator Inpatient Diabetes Program [phone] (Team Pager from 8am to 5pm) [phone] ([facility] office) [phone] [facility] office) [phone] [facility] office)

## 2014-08-26 NOTE — Progress Notes (Signed)
PROGRESS NOTE  Bruce [place]ins XNT:[id number] DOB: 10-Jan-1961 DOA: 08/24/2014 PCP: No primary care provider on file.  Assessment/Plan: ARF: baseline appears to be normal -Likely secondary to severe dehydration due to GI infection. - IVF - f/u BMET daily  Hyponatremia: likely secondary to severe GI loss/dehydration - IVF -improving  Hypokalemia -replete  Metabolic acidosis: Anion gap 22. Bicarbonate 14. Likely mixed picture from GI losses, with bicarbonate loss from acute renal failure and anion gap from underlying infection - treatment as above - no need for bicarbonate replacement at this point time.  Diarrhea: Likely secondary to viral gastroenteritis versus acute bacterial. WBC 11.2, afebrile, and no abdominal pain. No bloody stools. - C. Difficile negative - Stool culture - probiotic - IVF as above - Zofran - Stool ova and parasite  Hyperglycemia- diabetic: 295. Likely secondary to underlying infection and possible underlying diabetes etiology- appears to be on steroids at home PRN for gout - A1c: 12 - SSI- added diabetic diet, may need lantus at d/c  Elevated AST/ALT -suspect from hypotension -trend -r/o viral hepatitis with panel  Hypertension: Normotensive to hypotensive on admission. Likely dehydrated. - Hold home Micardis and amlodipine  GERD: - Continue PPI  Gout: No gout flare in several months. - Hold patient's when necessary prednisone   Code Status: full Family Communication: patient Disposition Plan:    Consultants:    Procedures:      HPI/Subjective: Anxious about being d/c'd soon as his mother who is 106 is home alone Eating better and has more UOP  Objective: Filed Vitals:   08/26/14 0708  BP: 132/81  Pulse: 75  Temp: 98.3 F (36.8 C)  Resp: 20    Intake/Output Summary (Last 24 hours) at 08/26/14 0856 Last data filed at 08/25/14 1900  Gross per 24 hour  Intake 1718.75 ml  Output    200 ml  Net 1518.75 ml   Filed Weights     08/24/14 1614 08/24/14 2130  Weight: 81.647 kg (180 lb) 88.996 kg (196 lb 3.2 oz)    Exam:   General:  A+Ox3, NAD  Cardiovascular: rrr  Respiratory: clear  Abdomen: +BS, soft  Musculoskeletal: no edema   Data Reviewed: Basic Metabolic Panel:  Recent Labs Lab 08/24/14 2059 08/25/14 0239 08/25/14 0910 08/25/14 1448 08/26/14 0645  NA 125* 127* 123* 123* 130*  K 3.1* 3.1* 3.0* 2.7* 3.2*  CL 94* 99* 97* 96* 106  CO2 15* 15* 14* 15* 14*  GLUCOSE 229* 186* 199* 157* 122*  BUN 63* 63* 55* 49* 36*  CREATININE 4.43* 4.07* 3.68* 3.32* 2.39*  CALCIUM 7.1* 6.9* 7.0* 6.9* 7.2*  MG 2.1  --   --   --  2.0   Liver Function Tests:  Recent Labs Lab 08/24/14 1650 08/26/14 0645  AST 113* 203*  ALT 88* 167*  ALKPHOS 71 64  BILITOT 0.9 1.0  PROT 7.9 5.9*  ALBUMIN 3.6 2.7*   No results for input(s): LIPASE, AMYLASE in the last 168 hours. No results for input(s): AMMONIA in the last 168 hours. CBC:  Recent Labs Lab 08/24/14 1650 08/26/14 0645  WBC 11.2* 7.0  NEUTROABS 8.5*  --   HGB 16.1 13.1  HCT 43.6 37.3*  MCV 87.6 89.2  PLT 186 136*   Cardiac Enzymes: No results for input(s): CKTOTAL, CKMB, CKMBINDEX, TROPONINI in the last 168 hours. BNP (last 3 results) No results for input(s): BNP in the last 8760 hours.  ProBNP (last 3 results) No results for input(s): PROBNP in the last 8760  hours.  CBG:  Recent Labs Lab 08/25/14 0722 08/25/14 1141 08/25/14 1703 08/25/14 2118 08/26/14 0745  GLUCAP 167* 192* 127* 119* 131*    Recent Results (from the past 240 hour(s))  Clostridium Difficile by PCR (not at [facility])     Status: None   Collection Time: 08/24/14 11:50 PM  Result Value Ref Range Status   C difficile by pcr NEGATIVE NEGATIVE Final  Stool culture     Status: None (Preliminary result)   Collection Time: 08/24/14 11:50 PM  Result Value Ref Range Status   Specimen Description STOOL  Final   Special Requests NONE  Final   Culture   Final    Culture  reincubated for better growth Performed at [facility]    Report Status PENDING  Incomplete     Studies: US Renal  08/25/2014   CLINICAL DATA:  Acute kidney injury. Acute renal failure. History of diabetes and hypertension. Initial encounter.  EXAM: RENAL / URINARY TRACT ULTRASOUND COMPLETE  COMPARISON:  Acute abdominal series done 08/24/2014.  FINDINGS: Right Kidney:  Length: 14.1 cm. Echogenicity within normal limits. No mass or hydronephrosis visualized.  Left Kidney:  Length: 13.4 cm. Echogenicity within normal limits. No mass or hydronephrosis visualized.  Bladder:  Appears normal for the degree of bladder distention. Bilateral ureteral jets noted.  IMPRESSION: Normal renal ultrasound.  No hydronephrosis.   Electronically Signed   By: Carey Bullocks M.D.   On: 08/25/2014 16:06   Dg Abd Acute W/chest  08/24/2014   CLINICAL DATA:  Generalized body aches, weakness, nausea and diarrhea. Intermittent fever since Sunday.  EXAM: DG ABDOMEN ACUTE W/ 1V CHEST  COMPARISON:  None.  FINDINGS: Frontal view of the chest shows midline trachea and normal heart size. Minimal scarring in the medial left lower lobe. Lungs are otherwise clear.  Two views of the abdomen show gas in nondilated colon with minimal small bowel gas. Overall, relative paucity of gas in the abdomen. No unexpected radiopaque calculi. No free air.  IMPRESSION: Relative paucity of gas in the abdomen is nonspecific. No evidence of small-bowel obstruction.   Electronically Signed   By: Leanna Battles M.D.   On: 08/24/2014 17:23    Scheduled Meds: . acidophilus  2 capsule Oral Daily  . heparin  5,000 Units Subcutaneous 3 times per day  . insulin aspart  0-9 Units Subcutaneous TID WC  . pantoprazole  40 mg Oral Daily  . sodium chloride  3 mL Intravenous Q12H   Continuous Infusions: . sodium chloride 75 mL/hr at 08/26/14 1610   Antibiotics Given (last 72 hours)    None      Principal Problem:   Renal failure Active  Problems:   Hyponatremia   Hypokalemia   Essential hypertension, benign   Metabolic acidosis   Diarrhea   GERD without esophagitis   Gout    Time spent: 35 min    Shaneka Efaw  [facility] Pager [phone]. If 7PM-7AM, please contact night-coverage at [email or web address], password [facility] 08/26/2014, 8:56 AM  LOS: 2 days

## 2014-08-26 NOTE — Plan of Care (Signed)
Problem: Food- and Nutrition-Related Knowledge Deficit (NB-1.1) Goal: Nutrition education Formal process to instruct or train a patient/client in a skill or to impart knowledge to help patients/clients voluntarily manage or modify food choices and eating behavior to maintain or improve health. Outcome: Adequate for Discharge Received consult for Diet Education for new onset DM2 (A1c 12.0) Provided pt with handout Carbohydrate Counting For People With Diabetes by the [employer], walked pt through the handout, explaining what carbohydrates are and how to count them, provided sample meal plan. Diet recall showed pt eating 1-2 meals per day, mostly fast food. Encouraged pt to consume at least 2-3 meals and several snacks for better blood glucose control. Provided ideas to help plan for meals when on the road. Pt very grateful for the visit. Pt expressed understanding, teach-back method used, expect good compliance.  [name] A. Norberta Stobaugh Dietetic Intern Pager: [phone] 08/26/2014 4:57 PM

## 2014-08-27 DIAGNOSIS — E876 Hypokalemia: Secondary | ICD-10-CM

## 2014-08-27 DIAGNOSIS — E872 Acidosis: Secondary | ICD-10-CM

## 2014-08-27 DIAGNOSIS — R197 Diarrhea, unspecified: Secondary | ICD-10-CM

## 2014-08-27 DIAGNOSIS — E871 Hypo-osmolality and hyponatremia: Secondary | ICD-10-CM

## 2014-08-27 DIAGNOSIS — N179 Acute kidney failure, unspecified: Secondary | ICD-10-CM

## 2014-08-27 LAB — CBC
HEMATOCRIT: 37.7 % — AB (ref 39.0–52.0)
Hemoglobin: 13.2 g/dL (ref 13.0–17.0)
MCH: 31.6 pg (ref 26.0–34.0)
MCHC: 35 g/dL (ref 30.0–36.0)
MCV: 90.2 fL (ref 78.0–100.0)
Platelets: 155 10*3/uL (ref 150–400)
RBC: 4.18 MIL/uL — AB (ref 4.22–5.81)
RDW: 12.7 % (ref 11.5–15.5)
WBC: 7.2 10*3/uL (ref 4.0–10.5)

## 2014-08-27 LAB — COMPREHENSIVE METABOLIC PANEL
ALK PHOS: 87 U/L (ref 38–126)
ALT: 178 U/L — AB (ref 17–63)
AST: 170 U/L — ABNORMAL HIGH (ref 15–41)
Albumin: 2.7 g/dL — ABNORMAL LOW (ref 3.5–5.0)
Anion gap: 9 (ref 5–15)
BUN: 25 mg/dL — AB (ref 6–20)
CHLORIDE: 108 mmol/L (ref 101–111)
CO2: 17 mmol/L — ABNORMAL LOW (ref 22–32)
Calcium: 7.7 mg/dL — ABNORMAL LOW (ref 8.9–10.3)
Creatinine, Ser: 1.64 mg/dL — ABNORMAL HIGH (ref 0.61–1.24)
GFR, EST AFRICAN AMERICAN: 52 mL/min — AB (ref >60)
GFR, EST NON AFRICAN AMERICAN: 45 mL/min — AB (ref >60)
Glucose, Bld: 115 mg/dL — ABNORMAL HIGH (ref 65–99)
Potassium: 3.4 mmol/L — ABNORMAL LOW (ref 3.5–5.1)
Sodium: 134 mmol/L — ABNORMAL LOW (ref 135–145)
Total Bilirubin: 1.3 mg/dL — ABNORMAL HIGH (ref 0.3–1.2)
Total Protein: 5.9 g/dL — ABNORMAL LOW (ref 6.5–8.1)

## 2014-08-27 LAB — HEPATITIS PANEL, ACUTE
HEP B S AG: NEGATIVE
Hep A IgM: NEGATIVE
Hep B C IgM: NEGATIVE

## 2014-08-27 LAB — GLUCOSE, CAPILLARY
Glucose-Capillary: 117 mg/dL — ABNORMAL HIGH (ref 65–99)
Glucose-Capillary: 146 mg/dL — ABNORMAL HIGH (ref 65–99)
Glucose-Capillary: 141 mg/dL — ABNORMAL HIGH (ref 65–99)
Glucose-Capillary: 134 mg/dL — ABNORMAL HIGH (ref 65–99)

## 2014-08-27 MED ORDER — POTASSIUM CHLORIDE CRYS ER 20 MEQ PO TBCR
40.0000 meq | EXTENDED_RELEASE_TABLET | ORAL | Status: AC
  Administered 2014-08-27 (×2): 40 meq via ORAL
  Filled 2014-08-27 (×2): qty 2

## 2014-08-27 MED ORDER — SODIUM BICARBONATE 650 MG PO TABS
650.0000 mg | ORAL_TABLET | Freq: Two times a day (BID) | ORAL | Status: DC
  Administered 2014-08-27 – 2014-08-28 (×2): 650 mg via ORAL
  Filled 2014-08-27 (×2): qty 1

## 2014-08-27 NOTE — Progress Notes (Addendum)
PROGRESS NOTE  Bruce Blevins EML:[id number] DOB: December 25, 1960 DOA: 08/24/2014 PCP: No primary care provider on file.  Summary: 54 year old man presented with 4-5 days of watery profuse diarrhea with tender more episodes a day.admitted for acute kidney injury, hyponatremia, metabolic acidosis, diarrhea, hyperglycemia  Assessment/Plan: 1. Profuse diarrhea. Resolved.C. Difficile and O&P negative. Stool culture pending. 2. AKI. Rapidly improving. Secondary to severe diarrhea, fluid loss.Telmisartan on hold. 3. Metabolic acidosis anion gap, resolved. CO2 remains slightly low. Thought secondary to acute kidney injury, GI losses.   4. Hyponatremia. Improving.Thought secondary to severe GI loss. 5. Hypokalemia. 6. New diagnosis. diabetes mellitus. Hemoglobin A1c 12.minimal insulin requirement (10 units in 3 days). Plan discharge homeon oral medication. 7. Elevated AST, ALT. Thought secondary to hypotension. Hepatitis panel unremarkable.follow-up as an outpatient. 8. Gout. Prednisone on hold.   Continue IV fluids.  Replete potassium  CMP in the morning  Likely home 6/12.  Code Status: full code DVT prophylaxis: heparin Family Communication: none Disposition Plan: home  Brendia Sacks, MD  [facility]  Pager [phone] If 7PM-7AM, please contact night-coverage at [email or web address], password [facility] 08/27/2014, 5:33 PM  LOS: 3 days   Consultants:    Procedures:    Antibiotics:    HPI/Subjective: Feeling better. One episode of diarrhea today. Tolerating diet. No nausea or vomiting.  Objective: Filed Vitals:   08/26/14 1505 08/26/14 2317 08/27/14 0723 08/27/14 1508  BP: 128/79 128/68 140/77 133/80  Pulse: 86 80 77 98  Temp: 98.7 F (37.1 C) 99.1 F (37.3 C) 99 F (37.2 C) 98.9 F (37.2 C)  TempSrc: Oral Oral Oral Oral  Resp: 20 20 20 20   Height:      Weight:      SpO2: 97% 100% 98% 98%    Intake/Output Summary (Last 24 hours) at 08/27/14 1733 Last data filed at  08/27/14 1532  Gross per 24 hour  Intake   1783 ml  Output      0 ml  Net   1783 ml     Filed Weights   08/24/14 1614 08/24/14 2130  Weight: 81.647 kg (180 lb) 88.996 kg (196 lb 3.2 oz)    Exam:     Afebrile, vital signs stable, no hypoxia General:  Appears calm and comfortable Cardiovascular: RRR, no m/r/g. No LE edema. Telemetry: SR, no arrhythmias  Respiratory: CTA bilaterally, no w/r/r. Normal respiratory effort. Abdomen: soft, ntnd Psychiatric: grossly normal mood and affect, speech fluent and appropriate  Data reviewed:  Blood sugars well controlled.  Multiple voids. I/O not strictly measured.  Sodium level improving, on admission 123 >> >> 134.  Potassium 3.4.  CO2 improving, 17  BUN and creatinine continue to trend towards normal.  AST and ALT without significant change. Total bilirubin modestly elevated. Alkaline phosphatase normal.  CBC unremarkable.  Pertinent data since admission:  Hemoglobin A1c 12.0.  Hepatitis panel negative  Renal ultrasound unremarkable  Acute abdominal series unremarkable  Pending data:  Stool culture  Scheduled Meds: . acidophilus  2 capsule Oral Daily  . heparin  5,000 Units Subcutaneous 3 times per day  . insulin aspart  0-9 Units Subcutaneous TID WC  . pantoprazole  40 mg Oral Daily  . sodium chloride  3 mL Intravenous Q12H   Continuous Infusions: . sodium chloride 75 mL/hr at 08/27/14 8832    Principal Problem:   AKI (acute kidney injury) Active Problems:   Hyponatremia   Hypokalemia   Metabolic acidosis   Diarrhea   GERD without esophagitis   Gout  Time spent 25 minutes

## 2014-08-28 DIAGNOSIS — R945 Abnormal results of liver function studies: Secondary | ICD-10-CM

## 2014-08-28 DIAGNOSIS — E119 Type 2 diabetes mellitus without complications: Secondary | ICD-10-CM

## 2014-08-28 DIAGNOSIS — R7989 Other specified abnormal findings of blood chemistry: Secondary | ICD-10-CM

## 2014-08-28 LAB — COMPREHENSIVE METABOLIC PANEL
ALT: 152 U/L — AB (ref 17–63)
AST: 100 U/L — ABNORMAL HIGH (ref 15–41)
Albumin: 3 g/dL — ABNORMAL LOW (ref 3.5–5.0)
Alkaline Phosphatase: 98 U/L (ref 38–126)
Anion gap: 11 (ref 5–15)
BUN: 20 mg/dL (ref 6–20)
CALCIUM: 7.9 mg/dL — AB (ref 8.9–10.3)
CO2: 15 mmol/L — ABNORMAL LOW (ref 22–32)
Chloride: 105 mmol/L (ref 101–111)
Creatinine, Ser: 1.37 mg/dL — ABNORMAL HIGH (ref 0.61–1.24)
GFR calc Af Amer: >60 mL/min (ref >60)
GFR calc non Af Amer: 56 mL/min — ABNORMAL LOW (ref >60)
Glucose, Bld: 145 mg/dL — ABNORMAL HIGH (ref 65–99)
Potassium: 3.5 mmol/L (ref 3.5–5.1)
Sodium: 131 mmol/L — ABNORMAL LOW (ref 135–145)
Total Bilirubin: 1.4 mg/dL — ABNORMAL HIGH (ref 0.3–1.2)
Total Protein: 6.6 g/dL (ref 6.5–8.1)

## 2014-08-28 LAB — GLUCOSE, CAPILLARY
GLUCOSE-CAPILLARY: 145 mg/dL — AB (ref 65–99)
GLUCOSE-CAPILLARY: 132 mg/dL — AB (ref 65–99)

## 2014-08-28 MED ORDER — SODIUM BICARBONATE 650 MG PO TABS: 650.0000 mg | ORAL_TABLET | Freq: Two times a day (BID) | ORAL | Status: AC

## 2014-08-28 NOTE — Progress Notes (Signed)
PROGRESS NOTE  Macklin Jacquin ZOX:[id number] DOB: [birth year] DOA: 08/24/2014 PCP: Krista Blue, MD  Summary: 54 year old man presented with 4-5 days of watery profuse diarrhea with tender more episodes a day. Admitted for acute kidney injury, hyponatremia, metabolic acidosis, diarrhea, hyperglycemia  Assessment/Plan: 1. Profuse diarrhea. Overall improved, 1-2 stools per day. C. Difficile and O&P negative. Stool culture pending. No abd pain. No n/v. 2. AKI. Rapidly improving towards normal. Secondary to severe diarrhea, fluid loss.Telmisartan on hold. Renal ultrasound unremarkable. 3. Metabolic acidosis anion gap, resolved. CO2 remains low, AG normal. Secondary to GI losses.   4. Hyponatremia. Persists, asymptomatic. Secondary to severe GI loss. 5. Hypokalemia. Repleted. 6. New diagnosis diabetes mellitus. Hemoglobin A1c 12. Minimal insulin requirement. Plan discharge home on oral medication. 7. Elevated AST, ALT, total bilirubin. Thought secondary to hypotension. Hepatitis panel negative. Follow-up as an outpatient. 8. Gout. Prednisone on hold.   Overall much improved; minimal diarrhea, no vomiting, tolerating diet, wants to go home.  Reviewed labs with patient.  Home today on sodium bicarbonate 1 week.  F/u CMP in 1 week.   [phone]  Brendia Sacks, MD  [facility]  Pager [phone] If 7PM-7AM, please contact night-coverage at [email or web address], password [facility] 08/28/2014, 11:08 AM  LOS: 4 days   Consultants:    Procedures:    Antibiotics:    HPI/Subjective: Feeling better, no abd pain, no n/v. Eating fine. Chronic shoulder pain.  Objective: Filed Vitals:   08/26/14 2317 08/27/14 0723 08/27/14 1508 08/27/14 2204  BP: 128/68 140/77 133/80 172/93  Pulse: 80 77 98 96  Temp: 99.1 F (37.3 C) 99 F (37.2 C) 98.9 F (37.2 C) 98.8 F (37.1 C)  TempSrc: Oral Oral Oral Oral  Resp: Height:      Weight:      SpO2: 100% 98% 98% 98%     Intake/Output Summary (Last 24 hours) at 08/28/14 1108 Last data filed at 08/28/14 0827  Gross per 24 hour  Intake    646 ml  Output      0 ml  Net    646 ml     Filed Weights   08/24/14 1614 08/24/14 2130  Weight: 81.647 kg (180 lb) 88.996 kg (196 lb 3.2 oz)    Exam:    Afebrile, VSS, no hypoxia General:  Appears comfortable, calm. Cardiovascular: Regular rate and rhythm, no murmur, rub or gallop. No lower extremity edema. Respiratory: Clear to auscultation bilaterally, no wheezes, rales or rhonchi. Normal respiratory effort. Abdomen: soft, ntnd, no RUQ pain Psychiatric: grossly normal mood and affect, speech fluent and appropriate  Data reviewed:  Blood sugars stable  Multiple voids. I/O not strictly measured.  Sodium level labile, 131  Potassium 3.5  CO2 slightly lower, 15.  BUN now normal and creatinine continues to trend towards normal, 1.37  AST and ALT slightly lower. Total bilirubin modestly elevated without significant change. Alkaline phosphatase normal.  Pertinent data since admission:  Hemoglobin A1c 12.0.  Hepatitis panel negative  Renal ultrasound unremarkable  Acute abdominal series unremarkable  Pending data:  Stool culture  Scheduled Meds: . acidophilus  2 capsule Oral Daily  . heparin  5,000 Units Subcutaneous 3 times per day  . insulin aspart  0-9 Units Subcutaneous TID WC  . pantoprazole  40 mg Oral Daily  . sodium bicarbonate  650 mg Oral BID  . sodium chloride  3 mL Intravenous Q12H   Continuous Infusions:    Principal Problem:   AKI (acute kidney injury)  Active Problems:   Hyponatremia   Hypokalemia   Metabolic acidosis   Diarrhea   GERD without esophagitis   Gout   Time spent 25 minutes

## 2014-08-28 NOTE — Discharge Summary (Signed)
Physician Discharge Summary  Bruce Blevins MWU:[id number] DOB: 12-Mar-1961 DOA: 08/24/2014  PCP: Bruce Blue, MD  Admit date: 08/24/2014 Discharge date: 08/28/2014  Recommendations for Outpatient Follow-up:  1. Resolution of diarrhea 2. Consider repeat basic metabolic panel in 1 week to follow-up on modest hyponatremia on CO2. 3. New diagnosis diabetes mellitus with hemoglobin A1c of 12. Somewhat puzzling given minimum insulin requirement during this hospitalization. Only one episode of blood sugar over 200. He is required very little insulin here. After discussion with him plan for blood sugar checks at home, follow-up in 48 hours by telephone to determine whether he will require oral therapy. 4. Elevated AST, ALT, total bilirubin. Hepatitis panel was negative. Consider repeat hepatic function panel as an outpatient.    Follow-up Information    Follow up with Bruce Blue, MD. Schedule an appointment as soon as possible for a visit in 1 week.   Specialty:  Family Medicine   Contact information:   [address] [place] [address] [phone]      Discharge Diagnoses:  1. Perfuse diarrhea, presumed viral gastroenteritis 2. Acute kidney injury 3. Anion gap metabolic acidosis 4. Hyponatremia 5. New diagnosis diabetes mellitus type 2 6. Elevated AST, ALT, total bilirubin of unclear significance  Discharge Condition: improved Disposition: Home  Diet recommendation: Diabetic diet  Filed Weights   08/24/14 1614 08/24/14 2130  Weight: 81.647 kg (180 lb) 88.996 kg (196 lb 3.2 oz)    History of present illness:  54 year old man presented with 4-5 days of watery profuse diarrhea with tender more episodes a day. Admitted for acute kidney injury, hyponatremia, metabolic acidosis, diarrhea, hyperglycemia.  Hospital Course:  Bruce Blevins was treated with supportive care, IV fluids with resulting gradual improvement and near resolution of diarrhea. Acute kidney injury  essentially resolved on discharge, responding to fluids and withholding of ARB. Renal ultrasound was unremarkable. Hemoglobin A1c was noted to be remarkably elevated, however despite this he is required very little insulin during this hospitalization. Therefore discussion with him plans were made for glucometer checks to determine whether he may need oral medication. At time of discharge she is tolerating a diet, has no abdominal pain and 1-2 stools per day. Suspect CO2 and hyponatremia will spontaneously resolve as his condition continues to improve. He has no signs of severe infection nor acidosis. Significance of his elevated AST, ALT and total bilirubin is unclear. This can be followed up in the outpatient setting.  1. Profuse diarrhea. Overall improved, 1-2 stools per day. C. Difficile and O&P negative. Stool culture pending. No abd pain. No n/v. 2. AKI. Rapidly improving towards normal. Secondary to severe diarrhea, fluid loss.Telmisartan on hold. Renal ultrasound unremarkable. 3. Metabolic acidosis anion gap, resolved. CO2 remains low, AG normal. Secondary to GI losses.  4. Hyponatremia. Persists, asymptomatic. Secondary to severe GI loss. 5. Hypokalemia. Repleted. 6. New diagnosis diabetes mellitus. Hemoglobin A1c 12. Minimal insulin requirement. Plan discharge home on oral medication. 7. Elevated AST, ALT, total bilirubin. Thought secondary to hypotension. Hepatitis panel negative. Follow-up as an outpatient. 8. Gout. Prednisone on hold.   Home today on sodium bicarbonate 1 week.  F/u CMP in 1 week.   [phone]  Consultants:  none  Procedures:  none  Antibiotics:  none  Discharge Instructions  Discharge Instructions    Activity as tolerated - No restrictions    Complete by:  As directed      Diet Carb Modified    Complete by:  As directed  Discharge instructions    Complete by:  As directed   Call your physician or seek immediate medical attention for pain,  recurrent diarrhea, poor appetite, weakness, nausea, vomiting or worsening of condition. Pick up glucometer and start checking blood sugars 2 times a day, rotating basis, morning, lunch, dinner, evening and keep a log.          Current Discharge Medication List    START taking these medications   Details  sodium bicarbonate 650 MG tablet Take 1 tablet (650 mg total) by mouth 2 (two) times daily. Qty: 15 tablet, Refills: 0      CONTINUE these medications which have NOT CHANGED   Details  Acetaminophen-Caffeine 500-65 MG TABS Take 1.5-2 tablets by mouth daily as needed (for pain/migraine pain).    amLODipine (NORVASC) 5 MG tablet Take 5 mg by mouth daily. Refills: 3    omeprazole (PRILOSEC) 20 MG capsule Take 20 mg by mouth daily as needed (for acid reflux).      STOP taking these medications     telmisartan (MICARDIS) 80 MG tablet      predniSONE (DELTASONE) 20 MG tablet        Allergies  Allergen Reactions  . Hydrocodone     Generalized itching.    The results of significant diagnostics from this hospitalization (including imaging, microbiology, ancillary and laboratory) are listed below for reference.    Significant Diagnostic Studies: US Renal  08/25/2014   CLINICAL DATA:  Acute kidney injury. Acute renal failure. History of diabetes and hypertension. Initial encounter.  EXAM: RENAL / URINARY TRACT ULTRASOUND COMPLETE  COMPARISON:  Acute abdominal series done 08/24/2014.  FINDINGS: Right Kidney:  Length: 14.1 cm. Echogenicity within normal limits. No mass or hydronephrosis visualized.  Left Kidney:  Length: 13.4 cm. Echogenicity within normal limits. No mass or hydronephrosis visualized.  Bladder:  Appears normal for the degree of bladder distention. Bilateral ureteral jets noted.  IMPRESSION: Normal renal ultrasound.  No hydronephrosis.   Electronically Signed   By: Bruce Blevins M.D.   On: 08/25/2014 16:06   Dg Abd Acute W/chest  08/24/2014   CLINICAL DATA:   Generalized body aches, weakness, nausea and diarrhea. Intermittent fever since Sunday.  EXAM: DG ABDOMEN ACUTE W/ 1V CHEST  COMPARISON:  None.  FINDINGS: Frontal view of the chest shows midline trachea and normal heart size. Minimal scarring in the medial left lower lobe. Lungs are otherwise clear.  Two views of the abdomen show gas in nondilated colon with minimal small bowel gas. Overall, relative paucity of gas in the abdomen. No unexpected radiopaque calculi. No free air.  IMPRESSION: Relative paucity of gas in the abdomen is nonspecific. No evidence of small-bowel obstruction.   Electronically Signed   By: Leanna Battles M.D.   On: 08/24/2014 17:23    Microbiology: Recent Results (from the past 240 hour(s))  Clostridium Difficile by PCR (not at [facility])     Status: None   Collection Time: 08/24/14 11:50 PM  Result Value Ref Range Status   C difficile by pcr NEGATIVE NEGATIVE Final  Stool culture     Status: None (Preliminary result)   Collection Time: 08/24/14 11:50 PM  Result Value Ref Range Status   Specimen Description STOOL  Final   Special Requests NONE  Final   Culture   Final    NO SUSPICIOUS COLONIES, CONTINUING TO HOLD Performed at [facility]    Report Status PENDING  Incomplete  Ova and parasite examination  Status: None   Collection Time: 08/25/14  9:45 AM  Result Value Ref Range Status   Specimen Description STOOL  Final   Special Requests NONE  Final   Ova and parasites   Final    NO OVA OR PARASITES SEEN ABUNDANT WBC Performed at [facility]    Report Status 08/26/2014 FINAL  Final     Labs: Basic Metabolic Panel:  Recent Labs Lab 08/24/14 2059  08/25/14 0910 08/25/14 1448 08/26/14 0645 08/27/14 0615 08/28/14 0545  NA 125*  < > 123* 123* 130* 134* 131*  K 3.1*  < > 3.0* 2.7* 3.2* 3.4* 3.5  CL 94*  < > 97* 96* 106 108 105  CO2 15*  < > 14* 15* 14* 17* 15*  GLUCOSE 229*  < > 199* 157* 122* 115* 145*  BUN 63*  < > 55* 49* 36* 25* 20    CREATININE 4.43*  < > 3.68* 3.32* 2.39* 1.64* 1.37*  CALCIUM 7.1*  < > 7.0* 6.9* 7.2* 7.7* 7.9*  MG 2.1  --   --   --  2.0  --   --   < > = values in this interval not displayed. Liver Function Tests:  Recent Labs Lab 08/24/14 1650 08/26/14 0645 08/27/14 0615 08/28/14 0545  AST 113* 203* 170* 100*  ALT 88* 167* 178* 152*  ALKPHOS 71 64 87 98  BILITOT 0.9 1.0 1.3* 1.4*  PROT 7.9 5.9* 5.9* 6.6  ALBUMIN 3.6 2.7* 2.7* 3.0*   CBC:  Recent Labs Lab 08/24/14 1650 08/26/14 0645 08/27/14 0615  WBC 11.2* 7.0 7.2  NEUTROABS 8.5*  --   --   HGB 16.1 13.1 13.2  HCT 43.6 37.3* 37.7*  MCV 87.6 89.2 90.2  PLT 186 136* 155    CBG:  Recent Labs Lab 08/27/14 1143 08/27/14 1715 08/27/14 2142 08/28/14 0733 08/28/14 1106  GLUCAP 146* 141* 134* 145* 132*    Principal Problem:   AKI (acute kidney injury) Active Problems:   Hyponatremia   Hypokalemia   Metabolic acidosis   Diarrhea   GERD without esophagitis   Gout   Time coordinating discharge: 35 minutes  Signed:  Brendia Sacks, MD [facility] 08/28/2014, 12:02 PM

## 2014-08-28 NOTE — Progress Notes (Signed)
Pt's IV catheter removed and intact. Pt's IV site clean dry and intact. Discharge instructions, medications and follow up appointments were reviewed and discussed with patient. All questions answered and no further questions at this time. Pt escorted by nurse tech.

## 2014-08-29 LAB — STOOL CULTURE

## 2016-04-29 IMAGING — DX DG ABDOMEN ACUTE W/ 1V CHEST
4 series · 4 of 4 positions shown · non-contrast
Comparison: None.

CLINICAL DATA: Generalized body aches, weakness, nausea and
diarrhea. Intermittent fever since [REDACTED].

EXAM:
DG ABDOMEN ACUTE W/ 1V CHEST

[chest pa]
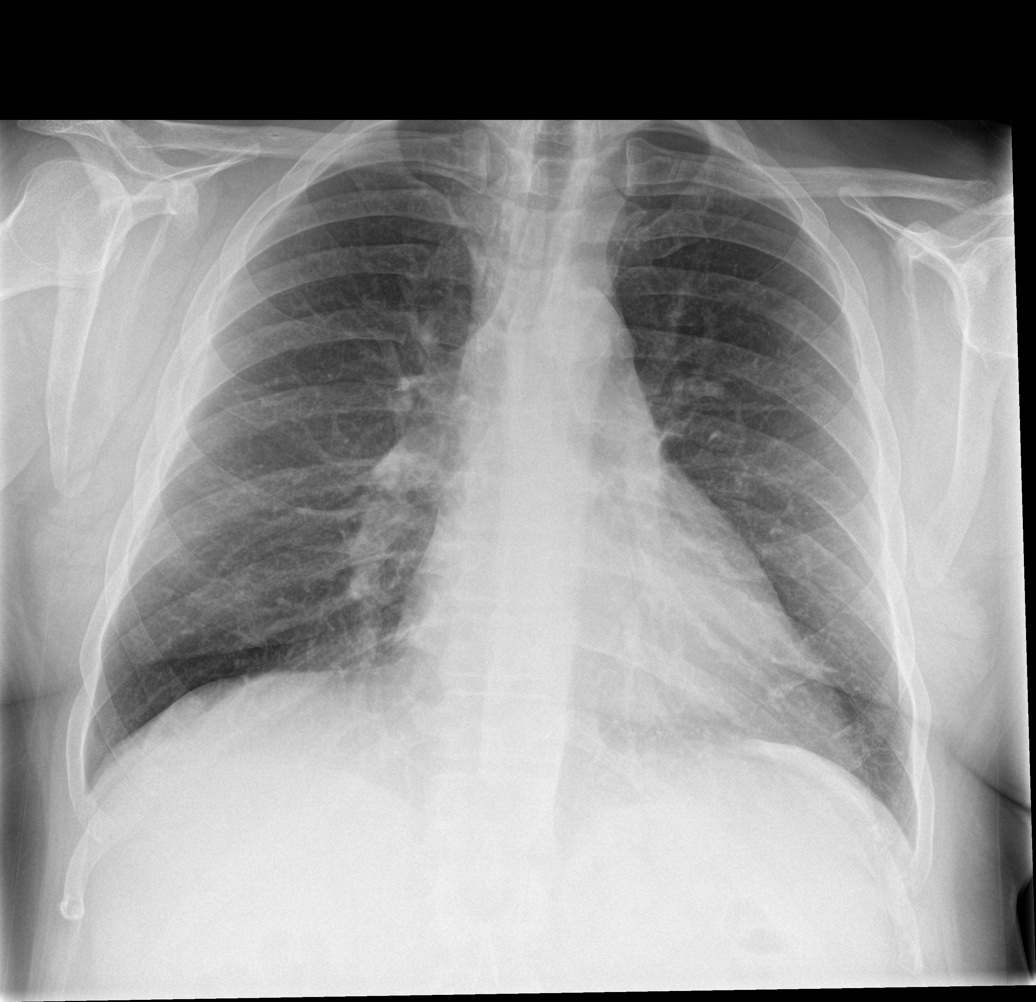

[abdomen erect]
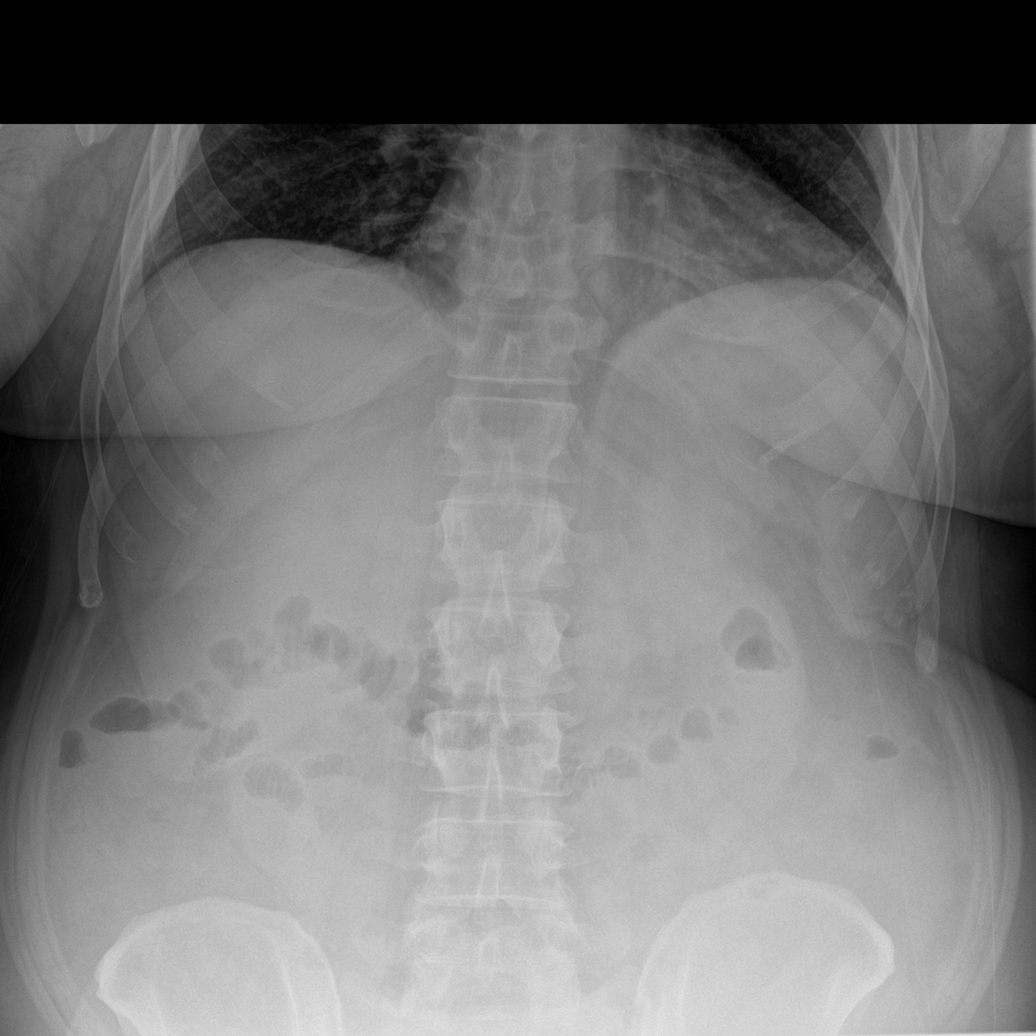

[abdomen supine (1 of 2)]
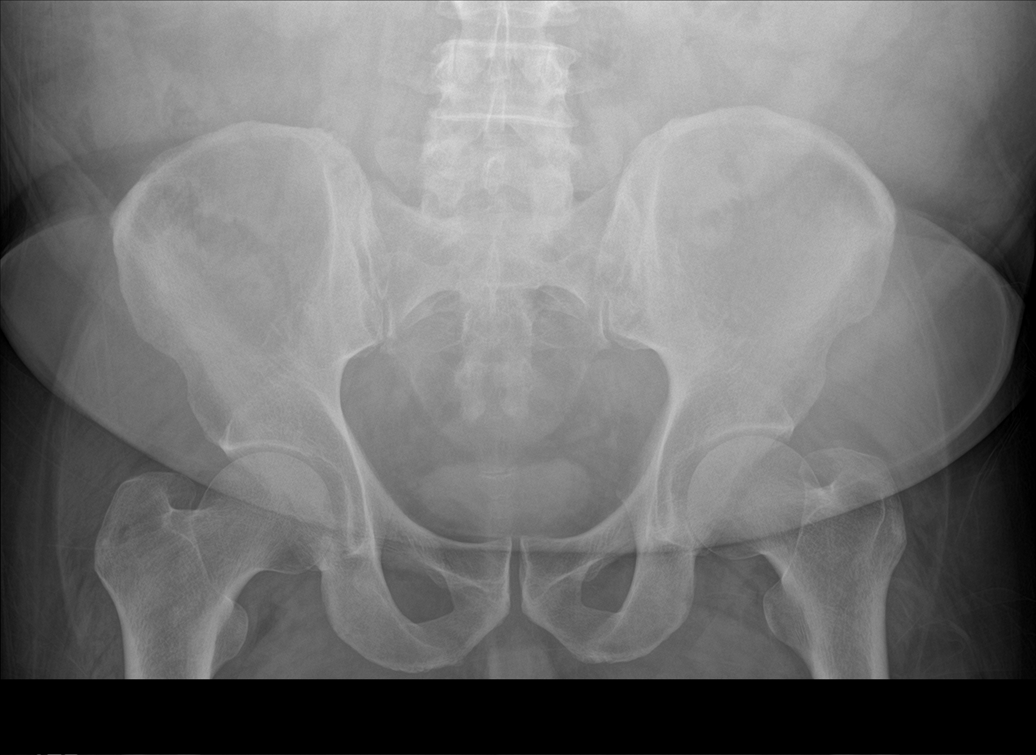

[abdomen supine (2 of 2)]
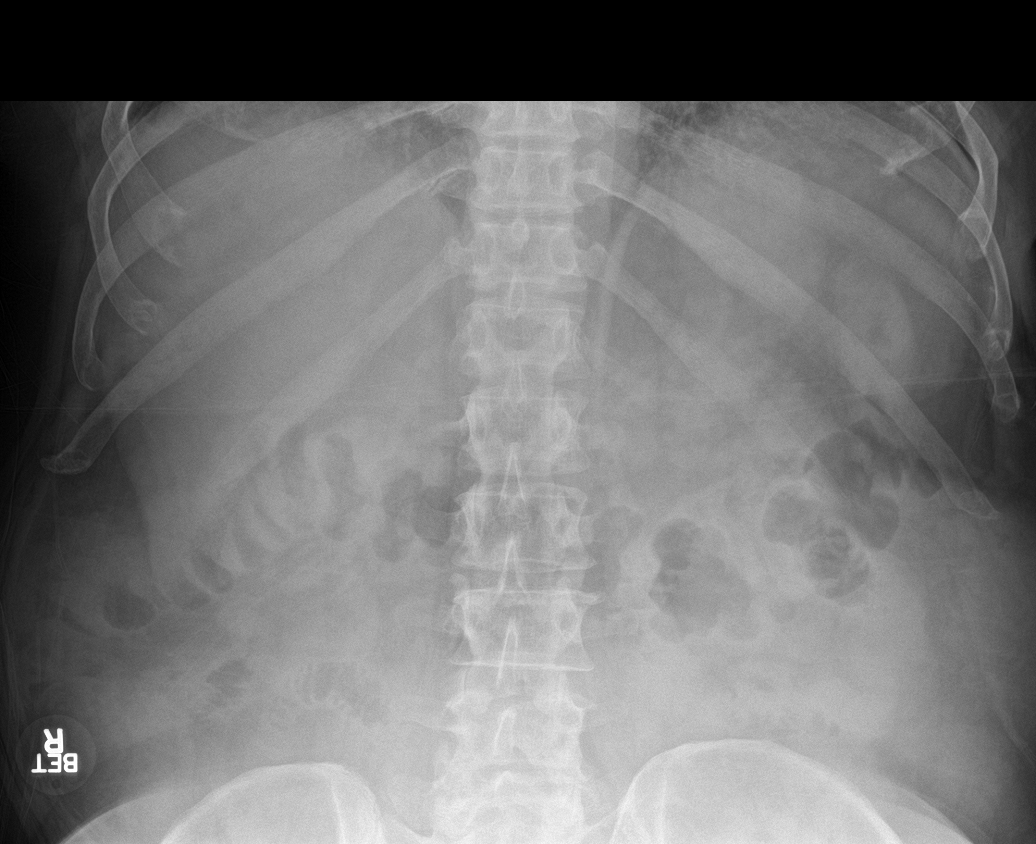

[4 of 4 positions shown; findings below may reference images not displayed]

FINDINGS: Frontal view of the chest shows midline trachea and normal heart
size. Minimal scarring in the medial left lower lobe. Lungs are
otherwise clear.

Two views of the abdomen show gas in nondilated colon with minimal
small bowel gas. Overall, relative paucity of gas in the abdomen. No
unexpected radiopaque calculi. No free air.
IMPRESSION: Relative paucity of gas in the abdomen is nonspecific. No evidence
of small-bowel obstruction.

## 2016-10-16 IMAGING — US US RENAL
1 series · 14 of 25 positions shown · non-contrast
Comparison: Acute abdominal series done 08/24/2014.

CLINICAL DATA: Acute kidney injury. Acute renal failure. History of
diabetes and hypertension. Initial encounter.

EXAM:
RENAL / URINARY TRACT ULTRASOUND COMPLETE

[Series 1: us renal · 0.26mm/px · 14 of 28 slices shown]
[im 1/28]
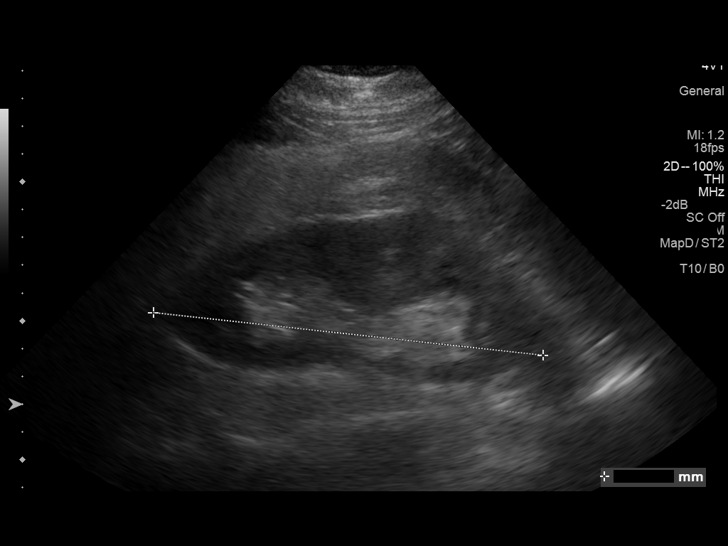
[im 3/28]
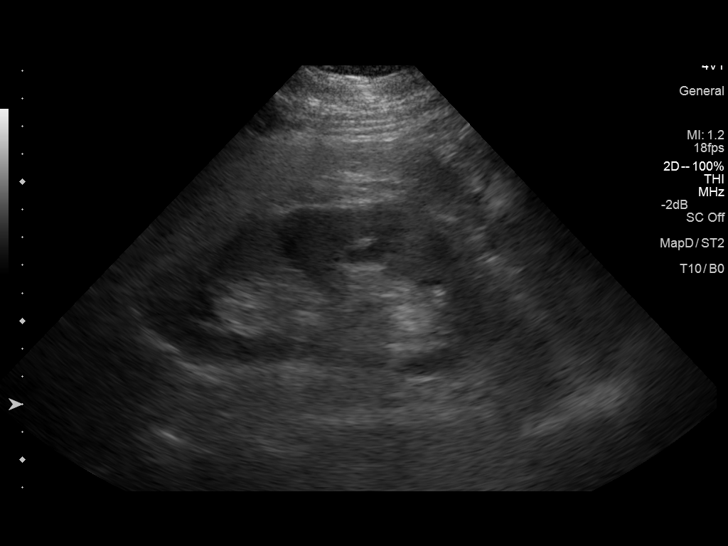
[im 5/28]
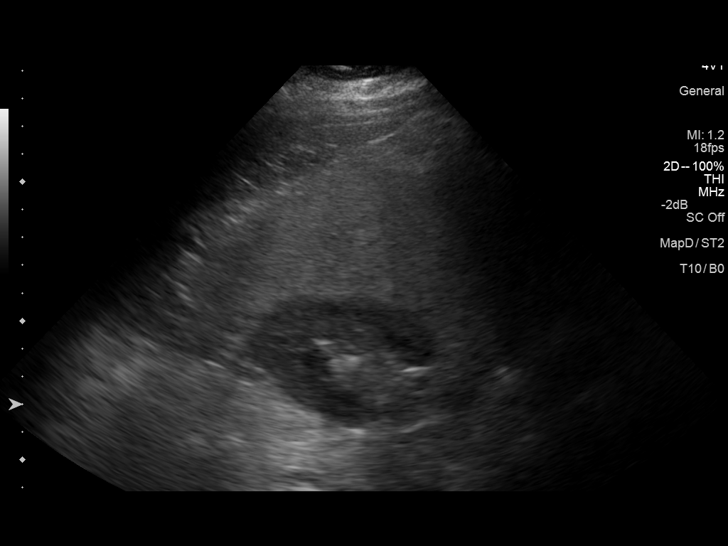
[im 7/28]
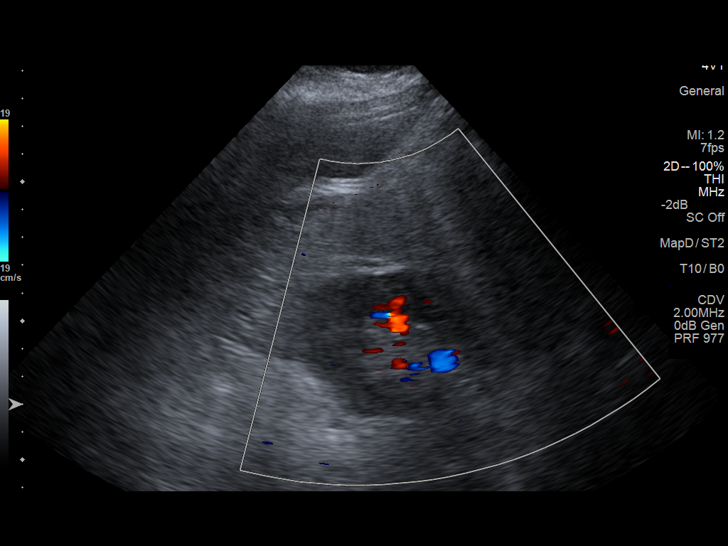
[im 10/28]
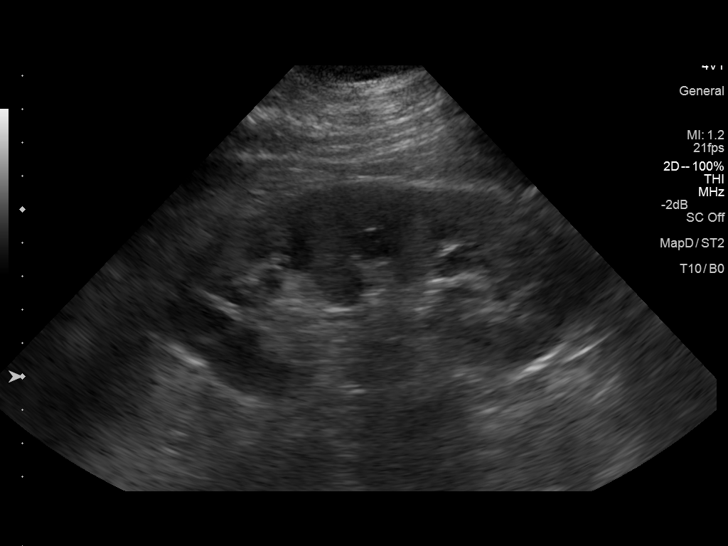
[im 11/28]
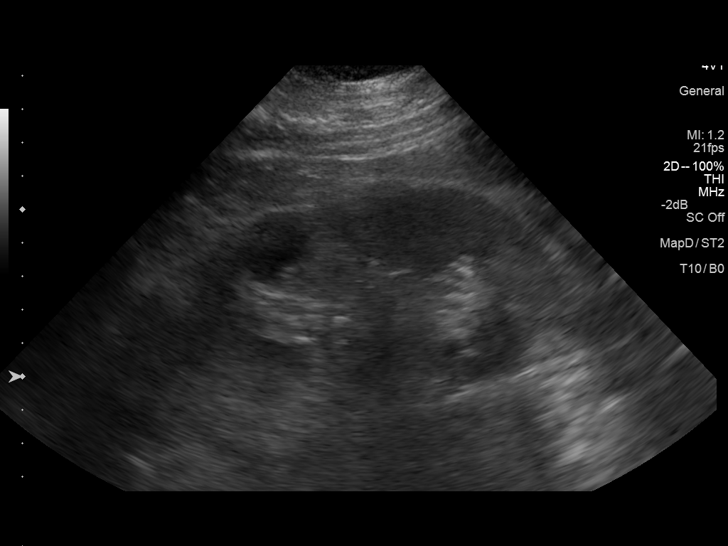
[im 13/28]
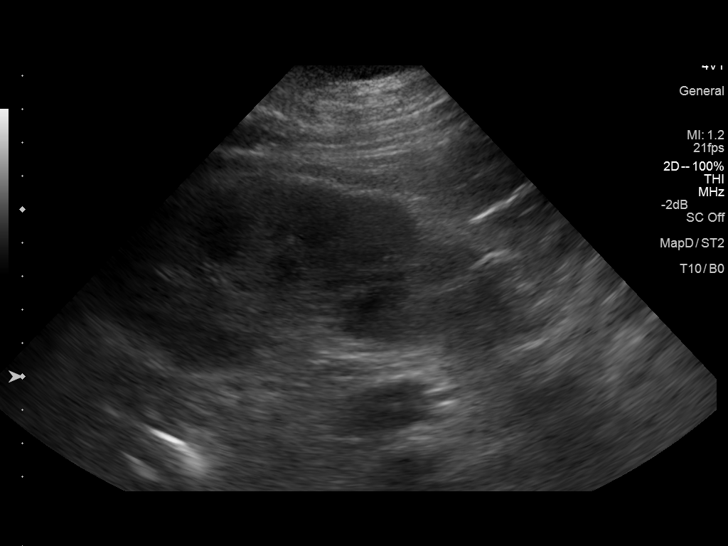
[im 15/28]
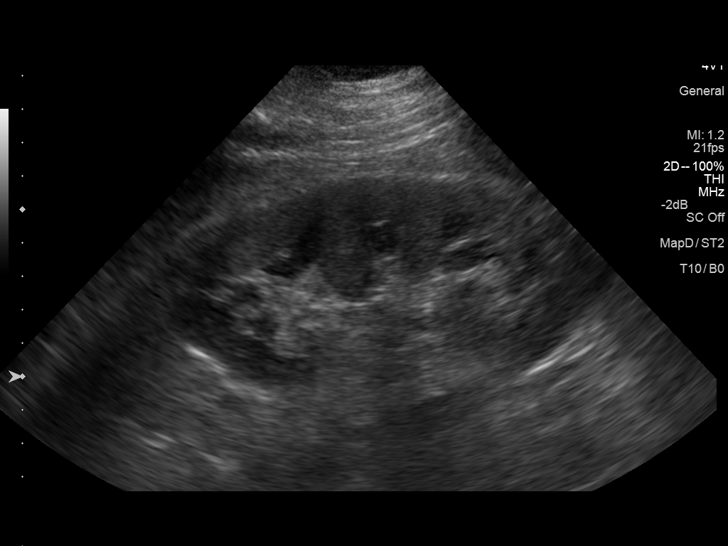
[im 17/28]
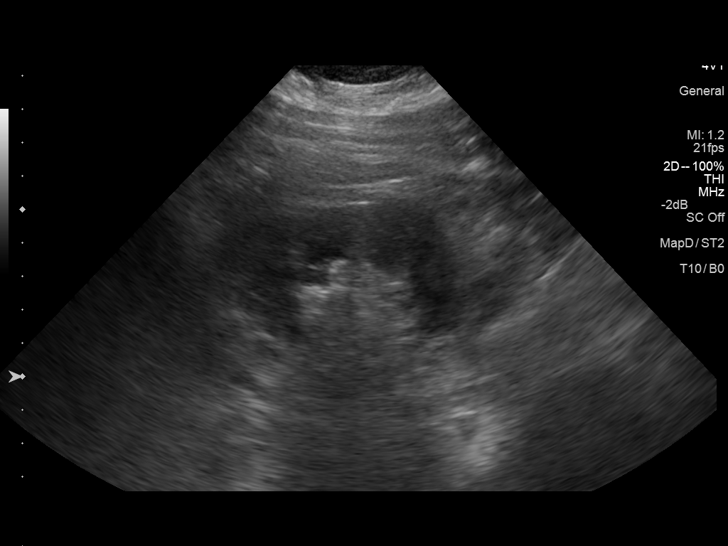
[im 19/28]
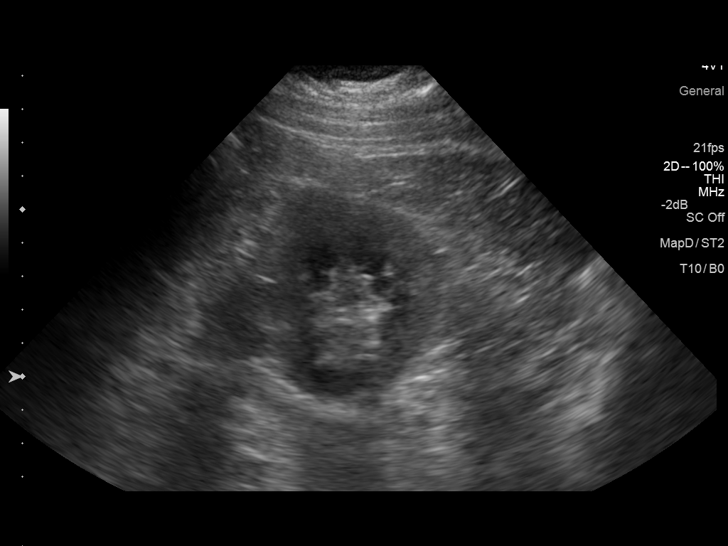
[im 21/28]
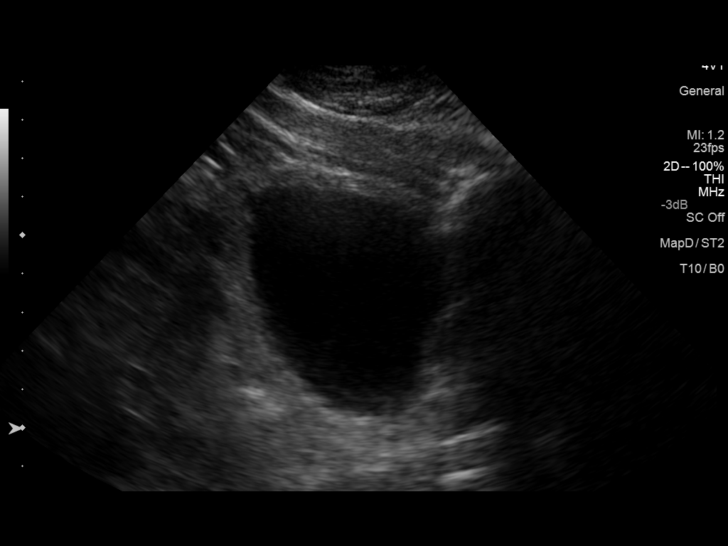
[im 23/28]
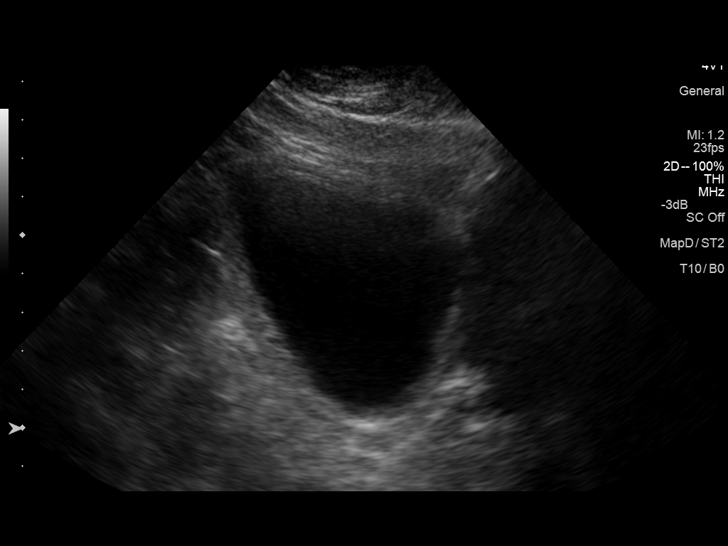
[im 25/28]
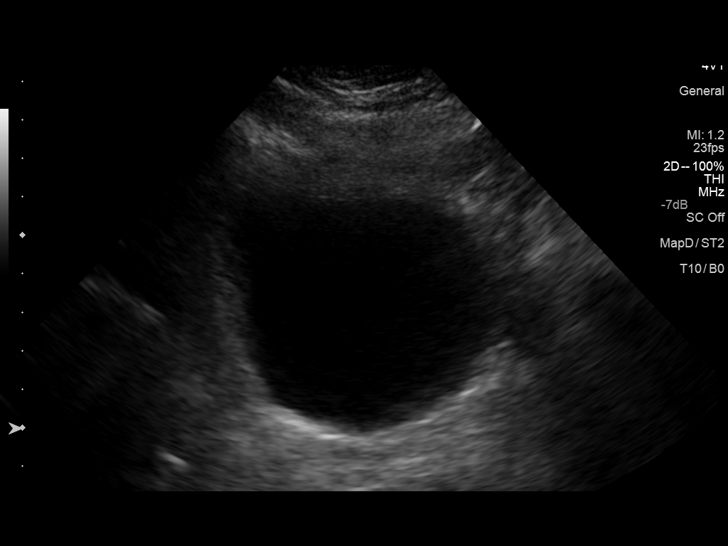
[im 28/28]
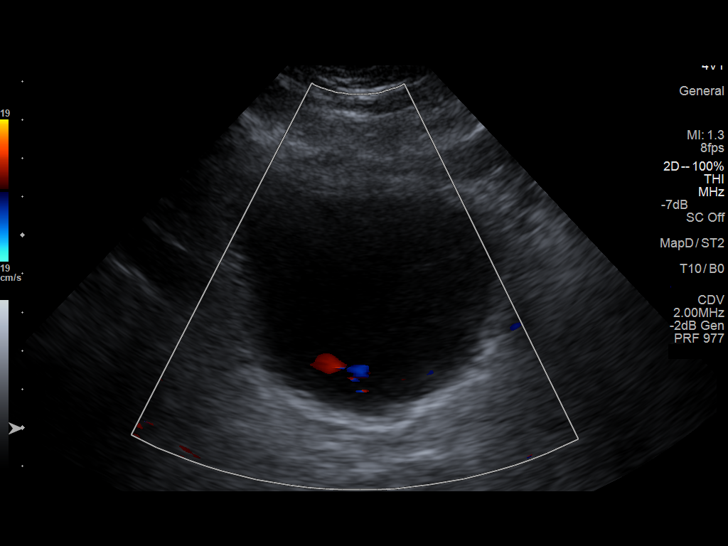

[14 of 25 positions shown; findings below may reference images not displayed]

FINDINGS: Right Kidney:

Length: 14.1 cm. Echogenicity within normal limits. No mass or
hydronephrosis visualized.

Left Kidney:

Length: 13.4 cm. Echogenicity within normal limits. No mass or
hydronephrosis visualized.

Bladder:

Appears normal for the degree of bladder distention. Bilateral
ureteral jets noted.
IMPRESSION: Normal renal ultrasound.  No hydronephrosis.

## 2017-02-15 DEATH — deceased
